# Patient Record
Sex: Female | Born: 1990 | Race: White | Hispanic: No | Marital: Married | Smoking: Never smoker
Health system: Southern US, Community
[De-identification: ages and names within clinical notes are randomized; demographics above are authoritative.]

## PROBLEM LIST (undated history)

## (undated) DIAGNOSIS — K219 Gastro-esophageal reflux disease without esophagitis: Secondary | ICD-10-CM

## (undated) DIAGNOSIS — Z8679 Personal history of other diseases of the circulatory system: Secondary | ICD-10-CM

## (undated) DIAGNOSIS — F988 Other specified behavioral and emotional disorders with onset usually occurring in childhood and adolescence: Secondary | ICD-10-CM

## (undated) DIAGNOSIS — F32A Depression, unspecified: Secondary | ICD-10-CM

## (undated) DIAGNOSIS — I10 Essential (primary) hypertension: Secondary | ICD-10-CM

## (undated) DIAGNOSIS — F419 Anxiety disorder, unspecified: Secondary | ICD-10-CM

## (undated) DIAGNOSIS — F329 Major depressive disorder, single episode, unspecified: Secondary | ICD-10-CM

## (undated) DIAGNOSIS — R000 Tachycardia, unspecified: Secondary | ICD-10-CM

## (undated) HISTORY — PX: FOOT SURGERY: SHX648

---
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
## 2010-02-07 ENCOUNTER — Ambulatory Visit: Payer: Self-pay | Admitting: Internal Medicine

## 2010-02-08 ENCOUNTER — Ambulatory Visit: Payer: Self-pay | Admitting: Internal Medicine

## 2010-07-31 ENCOUNTER — Ambulatory Visit: Payer: Self-pay | Admitting: Internal Medicine

## 2012-02-27 ENCOUNTER — Ambulatory Visit: Payer: Self-pay

## 2013-12-11 ENCOUNTER — Ambulatory Visit: Payer: Self-pay

## 2013-12-11 LAB — RAPID STREP-A WITH REFLX: MICRO TEXT REPORT: NEGATIVE

## 2013-12-14 LAB — BETA STREP CULTURE(ARMC)

## 2014-03-28 ENCOUNTER — Ambulatory Visit: Payer: Self-pay | Admitting: Neurology

## 2014-08-30 ENCOUNTER — Ambulatory Visit
Admission: EM | Admit: 2014-08-30 | Discharge: 2014-08-30 | Disposition: A | Discharge status: Home / Self Care | Payer: 59 | Attending: Family Medicine | Admitting: Family Medicine

## 2014-08-30 DIAGNOSIS — J029 Acute pharyngitis, unspecified: Secondary | ICD-10-CM | POA: Diagnosis not present

## 2014-08-30 HISTORY — DX: Tachycardia, unspecified: R00.0

## 2014-08-30 HISTORY — DX: Major depressive disorder, single episode, unspecified: F32.9

## 2014-08-30 HISTORY — DX: Depression, unspecified: F32.A

## 2014-08-30 LAB — RAPID STREP SCREEN (MED CTR MEBANE ONLY): STREPTOCOCCUS, GROUP A SCREEN (DIRECT): NEGATIVE

## 2014-08-30 MED ORDER — CLINDAMYCIN HCL 300 MG PO CAPS: 300.0000 mg | ORAL_CAPSULE | Freq: Three times a day (TID) | ORAL | Status: AC

## 2014-08-30 NOTE — ED Notes (Signed)
Sore throat and congestion, Fever of 101 at home. Started last night.

## 2014-08-30 NOTE — ED Provider Notes (Addendum)
SUBJECTIVE:  Jamie Hurley is a 24 y.o. female who complains of sore throat and fever since yesterday. No significant nasal drainage, abdominal pain, severe headache, rash, CP, SOB, N/V/D. Has had strep pharyngitis similar to this in the past.   ROS Negative except mentioned above.    OBJECTIVE: She appears well, vital signs are as noted.  General: NAD HEENT: moderate pharyngeal erythema, minimal exudate, no erythema of TMs, no cervical LAD Respiratory: CTA B Cardiology: tachycardic  Abdomen: +BS, NT/ND, no guarding or rebound Neurological: CN II-XII grossly intact   ASSESSMENT:  Pharyngitis   PLAN: Clindamycin since patient states can't swallow Amoxicillin and Zithromax has interactions with other medications patient takes,Tylenol/Motrin prn, rest, hydration, seek medical attention if symptoms persist or worsen.        Jolene Provost, MD 08/30/14 1157  Jolene Provost, MD 08/30/14 1610  Jolene Provost, MD 08/30/14 [phone]

## 2014-09-01 ENCOUNTER — Telehealth: Payer: Self-pay

## 2014-09-01 LAB — CULTURE, GROUP A STREP (THRC)

## 2014-09-01 NOTE — ED Notes (Signed)
Patient called stating Dr. Allena Katz had called yesterday to give Lab results but never go them. Result given and states is improving. Needs a work note for Sunday ad Monday (today). Given to patient

## 2014-10-21 ENCOUNTER — Other Ambulatory Visit: Payer: Self-pay | Admitting: Family Medicine

## 2014-10-21 DIAGNOSIS — G8929 Other chronic pain: Secondary | ICD-10-CM

## 2014-10-21 DIAGNOSIS — R1013 Epigastric pain: Principal | ICD-10-CM

## 2014-10-22 ENCOUNTER — Ambulatory Visit
Admission: RE | Admit: 2014-10-22 | Discharge: 2014-10-22 | Disposition: A | Discharge status: Home / Self Care | Payer: 59 | Source: Ambulatory Visit | Attending: Family Medicine | Admitting: Family Medicine

## 2014-10-22 DIAGNOSIS — G8929 Other chronic pain: Secondary | ICD-10-CM

## 2014-10-22 DIAGNOSIS — R1031 Right lower quadrant pain: Secondary | ICD-10-CM | POA: Diagnosis present

## 2014-10-22 DIAGNOSIS — R1013 Epigastric pain: Secondary | ICD-10-CM

## 2016-05-17 ENCOUNTER — Ambulatory Visit
Admission: EM | Admit: 2016-05-17 | Discharge: 2016-05-17 | Disposition: A | Discharge status: Home / Self Care | Payer: Worker's Compensation | Attending: Internal Medicine | Admitting: Internal Medicine

## 2016-05-17 ENCOUNTER — Ambulatory Visit: Payer: Worker's Compensation

## 2016-05-17 ENCOUNTER — Encounter: Payer: Self-pay | Admitting: Emergency Medicine

## 2016-05-17 DIAGNOSIS — S93432A Sprain of tibiofibular ligament of left ankle, initial encounter: Secondary | ICD-10-CM

## 2016-05-17 DIAGNOSIS — S93492A Sprain of other ligament of left ankle, initial encounter: Secondary | ICD-10-CM

## 2016-05-17 DIAGNOSIS — M25572 Pain in left ankle and joints of left foot: Secondary | ICD-10-CM

## 2016-05-17 NOTE — ED Provider Notes (Signed)
CSN: [id number]     Arrival date & time 05/17/16  1931 History   None    Chief Complaint  Patient presents with  . Ankle Pain   HPI Presents today for evaluation of left ankle pain following an injury at work today.  She works at [employer] in [place] and states that earlier today she was walking and slipped on water.  She reports an inversion injury to the left ankle and difficulty weightbearing since.   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  Pt reports pain along the lateral aspect of the ankle today, denies any medial sided pain.  No increase in swelling or bruising since the fall.  No numbness or tingling to the left ankle or foot.  This is possibly a worker's compensation case.  Past Medical History:  Diagnosis Date  . Depression   . Tachycardia    Past Surgical History:  Procedure Laterality Date  . FOOT SURGERY     No family history on file. Social History  Substance Use Topics  . Smoking status: Never Smoker  . Smokeless tobacco: Never Used  . Alcohol use Yes     Comment: occasional   OB History    No data available     Review of Systems  HENT: Negative.   Eyes: Negative.   Respiratory: Negative.   Cardiovascular: Negative.   Gastrointestinal: Negative.   Endocrine: Negative.   Genitourinary: Negative.   Musculoskeletal: Positive for gait problem and myalgias.  Skin: Negative.   Allergic/Immunologic: Negative.   Neurological: Positive for weakness. Negative for numbness.  Hematological: Negative.   Psychiatric/Behavioral: Negative.     Allergies  Patient has no known allergies.  Home Medications   Prior to Admission medications   Medication Sig Start Date End Date Taking? Authorizing Provider  acetaminophen (TYLENOL) 500 MG tablet Take 500 mg by mouth every 6 (six) hours as needed.   Yes [provider]  ibuprofen (ADVIL,MOTRIN) 400 MG tablet  Take 400 mg by mouth every 6 (six) hours as needed.   Yes [provider]  nebivolol (BYSTOLIC) 5 MG tablet Take 5 mg by mouth daily.   Yes [provider]  nortriptyline (PAMELOR) 10 MG capsule Take 10 mg by mouth at bedtime.   Yes [provider]  omeprazole (PRILOSEC) 20 MG capsule Take 20 mg by mouth daily.   Yes [provider]  traZODone (DESYREL) 50 MG tablet Take 50 mg by mouth at bedtime.   Yes [provider]  citalopram (CELEXA) 10 MG tablet Take 10 mg by mouth daily.    [provider]  metoprolol tartrate (LOPRESSOR) 25 MG tablet Take 25 mg by mouth 2 (two) times daily.    [provider]   Meds Ordered and Administered this Visit  Medications - No data to display  BP 116/71 (BP Location: Left Arm)   Pulse 82   Temp 99 F (37.2 C) (Oral)   Resp 16   Ht 5\' 1"  (1.549 m)   Wt 150 lb (68 kg)   LMP 05/07/2016   SpO2 100%   BMI 28.34 kg/m  No data found.  Physical Exam  Left Lower Extremity: Examination of the left lower extremity reveals no bony abnormality, no edema, and no ecchymosis.  The patient is non tender to palpation in the medial aspect of the ankle and moderately tender with palpation over the  lateral ankle.  The patient has no posterior but moderate anterior talotibial joint discomfort.  The patient is apprehensive with any motion to the left ankle at todays exam. The patient has no crepitus with range of motion activities.  The patient has a stable ankle with a negative anterior drawer test, and a negative subtalar tilt test.  Moderate tenderness laterally with any attempted ROM of the left ankle.  The patient has full dorsiflexion and plantar flexion against stress, showing full muscle strength however this motion is painful.  The patient has normal sensation to light touch.  The patient has a less than 2 second capillary refill.  The patient has normal skin warmth.    Urgent Care Course     Procedures  (including critical care time)  Labs Review Labs Reviewed - No data to display  Imaging Review Dg Ankle Complete Left  Result Date: 05/17/2016 CLINICAL DATA:  Pain following fall EXAM: LEFT ANKLE COMPLETE - 3+ VIEW COMPARISON:  None. FINDINGS: Frontal, oblique, and lateral views were obtained. There is mild soft tissue swelling. No fracture or joint effusion. Ankle mortise appears intact. No appreciable joint space narrowing or erosion. Postoperative changes noted in the proximal first metatarsal. IMPRESSION: Mild soft tissue swelling. No fracture or appreciable arthropathy. Ankle mortise appears intact. Electronically Signed   By: Bretta BangWilliam  Woodruff III M.D.   On: 05/17/2016 20:09   MDM   1. Sprain of anterior talofibular ligament of left ankle, initial encounter   -Patient was placed into a left ankle ASO brace today, continue crutches. -Given work note to remain out of work for the next 3 days, should follow-up with PCP if continued ankle discomfort at that time. -Tylenol and Ibuprofen as needed for discomfort at home.   Anson OregonMcGhee, Awais Cobarrubias Lance, [place]-C 05/17/16 2034

## 2016-05-17 NOTE — Discharge Instructions (Signed)
-  Remain out of work for 3 days. -Follow-up with PCP in next several days for further evaluation. -If persistent ankle pain, may need referral to orthopaedics

## 2016-05-17 NOTE — ED Triage Notes (Signed)
Left ankle pain started today at work

## 2016-05-21 ENCOUNTER — Ambulatory Visit
Admission: EM | Admit: 2016-05-21 | Discharge: 2016-05-21 | Disposition: A | Discharge status: Home / Self Care | Payer: Worker's Compensation | Attending: Family Medicine | Admitting: Family Medicine

## 2016-05-21 ENCOUNTER — Ambulatory Visit (INDEPENDENT_AMBULATORY_CARE_PROVIDER_SITE_OTHER): Payer: Worker's Compensation

## 2016-05-21 ENCOUNTER — Encounter: Payer: Self-pay | Admitting: Gynecology

## 2016-05-21 DIAGNOSIS — M25561 Pain in right knee: Secondary | ICD-10-CM

## 2016-05-21 NOTE — ED Triage Notes (Signed)
Patient was seen on 05/17/16 for left ankle pain. Patient had stated fallen at work. Patient now with pain in her right knee.

## 2016-05-21 NOTE — ED Provider Notes (Signed)
[facility] ____________________________________________  Time seen: Approximately 8:46 AM  I have reviewed the triage vital signs and the nursing notes.   HISTORY  Chief Complaint Knee Pain   HPI Jamie Hurley is a 26 y.o. female presents for evaluation of right knee pain. 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 Patient reports that on that day she was seen in the urgent care for evaluation of left ankle pain in which she states has much improved and does not bother her at this time. States today she is here for evaluation of right knee pain. States right knee pain currently is mild but worse with direct ambulation. Reports she has been resting her knee and taking over-the-counter ibuprofen which does help some. Patient states that she wanted to make sure that she had a fracture as the pain has continued. 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 Denies pain radiation, paresthesias or decreased range of motion. Denies head injury, loss of consciousness, neck or back pain or other injury.  Denies chest pain, shortness of breath, abdominal pain, dysuria, or rash. Denies recent sickness. Denies recent antibiotic use.   Sula Rumple, MD: PCP Patient's last menstrual period was 05/07/2016. Denies pregnancy.    Past Medical History:  Diagnosis Date  . Depression   . Tachycardia     There are no active problems to display for this patient.   Past Surgical History:  Procedure Laterality Date  . FOOT SURGERY       No current facility-administered medications for this encounter.   Current Outpatient Prescriptions:  .  acetaminophen (TYLENOL) 500 MG tablet, Take 500 mg by mouth every 6 (six) hours as needed., Disp: , Rfl:  .  citalopram (CELEXA) 10 MG tablet, Take 10 mg by mouth daily.,  Disp: , Rfl:  .  ibuprofen (ADVIL,MOTRIN) 400 MG tablet, Take 400 mg by mouth every 6 (six) hours as needed., Disp: , Rfl:  .  metoprolol tartrate (LOPRESSOR) 25 MG tablet, Take 25 mg by mouth 2 (two) times daily., Disp: , Rfl:  .  nebivolol (BYSTOLIC) 5 MG tablet, Take 5 mg by mouth daily., Disp: , Rfl:  .  nortriptyline (PAMELOR) 10 MG capsule, Take 10 mg by mouth at bedtime., Disp: , Rfl:  .  omeprazole (PRILOSEC) 20 MG capsule, Take 20 mg by mouth daily., Disp: , Rfl:  .  traZODone (DESYREL) 50 MG tablet, Take 50 mg by mouth at bedtime., Disp: , Rfl:   Allergies Patient has no known allergies.  No family history on file.  Social History Social History  Substance Use Topics  . Smoking status: Never Smoker  . Smokeless tobacco: Never Used  . Alcohol use Yes     Comment: occasional    Review of Systems Constitutional: No fever/chills Cardiovascular: Denies chest pain. Respiratory: Denies shortness of breath. Gastrointestinal: No abdominal pain.  No nausea, no vomiting.  No diarrhea.  No constipation. Genitourinary: Negative for dysuria. Musculoskeletal: Negative for back pain. As above. Skin: Negative for rash.  ____________________________________________   PHYSICAL EXAM:  VITAL SIGNS: ED Triage Vitals  Enc Vitals Group     BP 05/21/16 0835 106/68     Pulse Rate 05/21/16 0835 89     Resp 05/21/16 0835 16     Temp 05/21/16 0835 98.1  F (36.7 C)     Temp Source 05/21/16 0835 Oral     SpO2 05/21/16 0835 100 %     Weight 05/21/16 0837 150 lb (68 kg)     Height --      Head Circumference --      Peak Flow --      Pain Score 05/21/16 0837 7     Pain Loc --      Pain Edu? --      Excl. in GC? --     Constitutional: Alert and oriented. Well appearing and in no acute distress. ENT      Head: Normocephalic and atraumatic.. Cardiovascular: Normal rate, regular rhythm. Grossly normal heart sounds.  Good peripheral circulation. Respiratory: Normal respiratory effort  without tachypnea nor retractions. Breath sounds are clear and equal bilaterally. No wheezes, rales, rhonchi. Musculoskeletal:   No midline cervical, thoracic or lumbar tenderness to palpation. Bilateral pedal pulses equal and easily palpated.  except: Right anterior knee mild to moderate tenderness to direct palpation, mild pain with resisted knee extension, no pain with his knee flexion, no pain with medial or lateral stress, mild pain with anterior drawer test, no pain with posterior drawer test, no ecchymosis, no swelling noted, foreign range of motion present, right lower extremity otherwise nontender. Left ankle with splint present, no pain with palpation. Ambulatory with steady gait. Neurologic:  Normal speech and language. Speech is normal. No gait instability.  Skin:  Skin is warm, dry and intact. No rash noted. Psychiatric: Mood and affect are normal. Speech and behavior are normal. Patient exhibits appropriate insight and judgment   ___________________________________________   LABS (all labs ordered are listed, but only abnormal results are displayed)  Labs Reviewed - No data to display ____________________________________________  RADIOLOGY  Dg Knee Complete 4 Views Right  Result Date: 05/21/2016 CLINICAL DATA:  Pt with anterior right knee pain after fall four days ago. Pt denies hx of previous injury or trauma. EXAM: RIGHT KNEE - COMPLETE 4+ VIEW COMPARISON:  None. FINDINGS: No evidence of fracture, dislocation, or joint effusion. No evidence of arthropathy or other focal bone abnormality. Soft tissues are unremarkable. IMPRESSION: Negative. Electronically Signed   By: Bary RichardStan  Maynard M.D.   On: 05/21/2016 09:34   ____________________________________________   PROCEDURES Procedures   INITIAL IMPRESSION / ASSESSMENT AND PLAN / ED COURSE  Pertinent labs & imaging results that were available during my care of the patient were reviewed by me and considered in my medical decision  making (see chart for details).  Well-appearing patient. No acute distress. Presents for evaluation night right knee pain that patient reports is been present since fallen directly on her knee at work 4 days ago. Suspect contusion and strain injury, patient requests to have further evaluation x-ray. Right x-ray per radiologist negative. Supportive sleeve a splint applied by RN. Encouraged rest, ice, elevation, over-the-counter Tylenol or ibuprofen as needed. Patient states that she has a next 3 days off of work and plans to rest knee. Discussed with patient returning to work, patient agrees and states that she is rated return. Note given that patient can return to work as of today but keep in splint over the next 3 days. Information given for Tommie Maximiano CossAnne Moore NP as well as orthopedic for follow-up as needed for continued pain or complaints.  Discussed follow up with Primary care physician this week. Discussed follow up and return parameters including no resolution or any worsening concerns. Patient verbalized understanding and agreed to  plan.   ____________________________________________   FINAL CLINICAL IMPRESSION(S) / ED DIAGNOSES  Final diagnoses:  Acute pain of right knee     Discharge Medication List as of 05/21/2016  9:51 AM      Note: This dictation was prepared with Dragon dictation along with smaller phrase technology. Any transcriptional errors that result from this process are unintentional.         Renford Dills, NP 05/21/16 1125

## 2016-05-21 NOTE — Discharge Instructions (Signed)
Rest. Ice. Stretch. Follow up with Tommie Maximiano CossAnne Moore NP and Orthopedic as needed for continued pain.   Follow up with your primary care physician this week as needed. Return to Urgent care for new or worsening concerns.

## 2016-08-28 ENCOUNTER — Encounter: Payer: Self-pay | Admitting: Gynecology

## 2016-08-28 ENCOUNTER — Ambulatory Visit
Admission: EM | Admit: 2016-08-28 | Discharge: 2016-08-28 | Disposition: A | Discharge status: Home / Self Care | Payer: 59 | Attending: Family Medicine | Admitting: Family Medicine

## 2016-08-28 DIAGNOSIS — K2970 Gastritis, unspecified, without bleeding: Secondary | ICD-10-CM

## 2016-08-28 DIAGNOSIS — K625 Hemorrhage of anus and rectum: Secondary | ICD-10-CM | POA: Diagnosis not present

## 2016-08-28 DIAGNOSIS — K2990 Gastroduodenitis, unspecified, without bleeding: Secondary | ICD-10-CM

## 2016-08-28 HISTORY — DX: Gastro-esophageal reflux disease without esophagitis: K21.9

## 2016-08-28 HISTORY — DX: Essential (primary) hypertension: I10

## 2016-08-28 LAB — OCCULT BLOOD X 1 CARD TO LAB, STOOL: Fecal Occult Bld: NEGATIVE

## 2016-08-28 MED ORDER — MELOXICAM 15 MG PO TABS: 15.0000 mg | ORAL_TABLET | Freq: Every day | ORAL | 0 refills | Status: DC

## 2016-08-28 MED ORDER — ORPHENADRINE CITRATE ER 100 MG PO TB12: 100.0000 mg | ORAL_TABLET | Freq: Two times a day (BID) | ORAL | 0 refills | Status: DC

## 2016-08-28 NOTE — Discharge Instructions (Signed)
Recommend she use Imodium over-the-counter until the PCR results come back

## 2016-08-28 NOTE — ED Triage Notes (Signed)
Patient c/o bloody stool x 3 days ago. Per patient lower back pressure/ diarrhea and stomach pain.

## 2016-08-28 NOTE — ED Provider Notes (Signed)
[facility]    CSN: [id number] Arrival date & time: 08/28/16  0801     History   Chief Complaint Chief Complaint  Patient presents with  . Rectal Bleeding    HPI Jamie Hurley is a 26 y.o. female.   Patient is a 26 year old white female who comes in complaining of having diarrhea since Friday. During the day on Friday she had blood in her stool as well the obvious bleeding stopped after Friday but since then every time she eats she has diarrhea. She reports as well as having the diarrhea now having back pain that occurs off and on is not linked or tied to her having bowel movements. She has had a history of ovarian cyst but this pain is different from that. She denies having any nausea vomiting. No dyspareunia or vaginal discharge dysuria frequency or urgency. She's not had any fever. She states that a couple years ago she had episode of diarrhea with some rectal bleeding but quickly cleared within 24 hours. She states that her GYN suggested that she may want to think about getting a colonoscopy in the future. She's had multiple feet surgeries but no other abdominal or significant surgeries. Habits she is on birth control pill antidepressants and medication to regulate her heart rate and fluctuations in her blood pressures but no other medications. No known drug allergies she dips snuff but does not smoke. No pertinent family medical history for colon cancer or other GI cancer malignancy   The history is provided by the patient and the spouse. No language interpreter was used.  Rectal Bleeding  Quality:  Bright red Amount:  Scant Duration:  1 day Timing:  Intermittent Context: defecation, diarrhea and spontaneously   Context: not anal fissures, not anal penetration, not constipation, not foreign body, not hemorrhoids, not rectal injury and not rectal pain   Similar prior episodes: no   Relieved by:  Nothing Worsened by:  Defecation Ineffective treatments:  None  tried Associated symptoms: no abdominal pain, no fever and no vomiting   Associated symptoms comment:  Back pain   Past Medical History:  Diagnosis Date  . Depression   . GERD (gastroesophageal reflux disease)   . Hypertension   . Tachycardia     There are no active problems to display for this patient.   Past Surgical History:  Procedure Laterality Date  . FOOT SURGERY      OB History    No data available       Home Medications    Prior to Admission medications   Medication Sig Start Date End Date Taking? Authorizing Provider  aspirin EC 81 MG tablet Take 81 mg by mouth daily.   Yes [provider]  nebivolol (BYSTOLIC) 5 MG tablet Take 5 mg by mouth daily.   Yes [provider]  norethindrone-ethinyl estradiol (JUNEL FE,GILDESS FE,LOESTRIN FE) 1-20 MG-MCG tablet Take 1 tablet by mouth daily.   Yes [provider]  nortriptyline (PAMELOR) 10 MG capsule Take 10 mg by mouth at bedtime.   Yes [provider]  omeprazole (PRILOSEC) 20 MG capsule Take 20 mg by mouth daily.   Yes [provider]  traZODone (DESYREL) 50 MG tablet Take 50 mg by mouth at bedtime.   Yes [provider]  venlafaxine XR (EFFEXOR-XR) 150 MG 24 hr capsule Take 150 mg by mouth daily with breakfast.   Yes [provider]  acetaminophen (TYLENOL) 500 MG tablet Take 500 mg by mouth every  6 (six) hours as needed.    [provider]  citalopram (CELEXA) 10 MG tablet Take 10 mg by mouth daily.    [provider]  ibuprofen (ADVIL,MOTRIN) 400 MG tablet Take 400 mg by mouth every 6 (six) hours as needed.    [provider]  meloxicam (MOBIC) 15 MG tablet Take 1 tablet (15 mg total) by mouth daily. 08/28/16   Hassan Rowan, MD  metoprolol tartrate (LOPRESSOR) 25 MG tablet Take 25 mg by mouth 2 (two) times daily.    [provider]  orphenadrine (NORFLEX) 100 MG tablet Take 1 tablet (100 mg total) by mouth 2 (two)  times daily. 08/28/16   Hassan Rowan, MD    Family History No family history on file.  Social History Social History  Substance Use Topics  . Smoking status: Never Smoker  . Smokeless tobacco: Never Used  . Alcohol use Yes     Comment: occasional     Allergies   Patient has no known allergies.   Review of Systems Review of Systems  Constitutional: Negative for activity change, appetite change, chills, diaphoresis, fatigue and fever.  Gastrointestinal: Positive for blood in stool, diarrhea and hematochezia. Negative for abdominal distention, abdominal pain, nausea, rectal pain and vomiting.  Genitourinary: Negative for difficulty urinating, dyspareunia, dysuria, flank pain, frequency, hematuria, pelvic pain, vaginal bleeding and vaginal discharge.  Musculoskeletal: Positive for back pain and myalgias.  Skin: Negative.   All other systems reviewed and are negative.    Physical Exam Triage Vital Signs ED Triage Vitals  Enc Vitals Group     BP 08/28/16 0822 116/65     Pulse Rate 08/28/16 0822 91     Resp 08/28/16 0822 16     Temp 08/28/16 0822 98.4 F (36.9 C)     Temp Source 08/28/16 0822 Oral     SpO2 08/28/16 0822 100 %     Weight 08/28/16 0824 150 lb (68 kg)     Height 08/28/16 0824 5\' 1"  (1.549 m)     Head Circumference --      Peak Flow --      Pain Score 08/28/16 0824 8     Pain Loc --      Pain Edu? --      Excl. in GC? --    No data found.   Updated Vital Signs BP 116/65 (BP Location: Left Arm)   Pulse 91   Temp 98.4 F (36.9 C) (Oral)   Resp 16   Ht 5\' 1"  (1.549 m)   Wt 150 lb (68 kg)   LMP 08/07/2016   SpO2 100%   BMI 28.34 kg/m   Visual Acuity Right Eye Distance:   Left Eye Distance:   Bilateral Distance:    Right Eye Near:   Left Eye Near:    Bilateral Near:     Physical Exam  Constitutional: She is oriented to person, place, and time. She appears well-developed and well-nourished. No distress.  HENT:  Head: Normocephalic and  atraumatic.  Right Ear: External ear normal.  Left Ear: External ear normal.  Eyes: Pupils are equal, round, and reactive to light.  Neck: Normal range of motion. Neck supple.  Pulmonary/Chest: Effort normal.  Abdominal: Soft. Bowel sounds are normal. She exhibits no distension and no mass. There is no hepatosplenomegaly. There is tenderness. There is no rigidity, no rebound, no guarding and no CVA tenderness. Hernia confirmed negative in the ventral area.  Genitourinary: Rectum normal and vagina normal. Rectal  exam shows no external hemorrhoid, no internal hemorrhoid, no fissure, no mass, no tenderness, anal tone normal and guaiac negative stool. There is no rash or lesion on the right labia. There is no rash or lesion on the left labia.  Musculoskeletal: Normal range of motion.  Neurological: She is alert and oriented to person, place, and time.  Skin: Skin is warm. She is not diaphoretic.  Psychiatric: She has a normal mood and affect. Her behavior is normal.  Vitals reviewed.    UC Treatments / Results  Labs (all labs ordered are listed, but only abnormal results are displayed) Labs Reviewed  GASTROINTESTINAL PANEL BY PCR, STOOL (REPLACES STOOL CULTURE)  OCCULT BLOOD X 1 CARD TO LAB, STOOL    EKG  EKG Interpretation None       Radiology No results found.  Procedures Procedures (including critical care time)  Medications Ordered in UC Medications - No data to display   Initial Impression / Assessment and Plan / UC Course  I have reviewed the triage vital signs and the nursing notes.  Pertinent labs & imaging results that were available during my care of the patient were reviewed by me and considered in my medical decision making (see chart for details).    Stool was negative for blood. Will get a PCR to evaluate for pathogen will place her on Mobic and Norflex for back pain, recommend Imodium to use for the diarrhea for now until we get the pathogen reports back work  today and tomorrow and recommend colonoscopy which she can get referred by her PCP   Final Clinical Impressions(s) / UC Diagnoses   Final diagnoses:  Rectal bleeding  Gastritis and gastroduodenitis    New Prescriptions New Prescriptions   MELOXICAM (MOBIC) 15 MG TABLET    Take 1 tablet (15 mg total) by mouth daily.   ORPHENADRINE (NORFLEX) 100 MG TABLET    Take 1 tablet (100 mg total) by mouth 2 (two) times daily.   Note: This dictation was prepared with Dragon dictation along with smaller phrase technology. Any transcriptional errors that result from this process are unintentional. Controlled Substance Prescriptions [place] Controlled Substance Registry consulted? Not Applicable       Hassan Rowan, MD 08/28/16 [phone]

## 2016-08-29 LAB — GASTROINTESTINAL PANEL BY PCR, STOOL (REPLACES STOOL CULTURE)
ADENOVIRUS F40/41: NOT DETECTED
Astrovirus: NOT DETECTED
CAMPYLOBACTER SPECIES: NOT DETECTED
CRYPTOSPORIDIUM: NOT DETECTED
Cyclospora cayetanensis: NOT DETECTED
ENTEROAGGREGATIVE E COLI (EAEC): NOT DETECTED
ENTEROPATHOGENIC E COLI (EPEC): DETECTED — AB
Entamoeba histolytica: NOT DETECTED
Enterotoxigenic E coli (ETEC): NOT DETECTED
Giardia lamblia: NOT DETECTED
NOROVIRUS GI/GII: NOT DETECTED
PLESIMONAS SHIGELLOIDES: NOT DETECTED
ROTAVIRUS A: NOT DETECTED
SALMONELLA SPECIES: NOT DETECTED
SHIGELLA/ENTEROINVASIVE E COLI (EIEC): NOT DETECTED
Sapovirus (I, II, IV, and V): NOT DETECTED
Shiga like toxin producing E coli (STEC): NOT DETECTED
Vibrio cholerae: NOT DETECTED
Vibrio species: NOT DETECTED
Yersinia enterocolitica: NOT DETECTED

## 2017-01-30 ENCOUNTER — Encounter: Payer: Self-pay | Admitting: Obstetrics and Gynecology

## 2017-01-30 ENCOUNTER — Ambulatory Visit (INDEPENDENT_AMBULATORY_CARE_PROVIDER_SITE_OTHER): Payer: 59 | Admitting: Obstetrics and Gynecology

## 2017-01-30 VITALS — BP 100/60 | HR 79 | Ht 61.0 in | Wt 159.0 lb

## 2017-01-30 DIAGNOSIS — Z3041 Encounter for surveillance of contraceptive pills: Secondary | ICD-10-CM

## 2017-01-30 DIAGNOSIS — N926 Irregular menstruation, unspecified: Secondary | ICD-10-CM | POA: Diagnosis not present

## 2017-01-30 NOTE — Progress Notes (Signed)
Chief Complaint  Patient presents with  . Amenorrhea    missed this month period    HPI:      [name] is a [age] y.o. G1P0010 who LMP was Patient's last menstrual period was 01/01/2017 (exact date)., presents today for eval of no menses on OCPs last month. No late/missed OCPs, been on same ones for several yrs. Menses usually monthly, lasting 4-6 days, med flow, no dysmen, no BTB. She had normal menses last month but no period this month. She had neg UPT 2 days ago and restarted OCPs normally. She is concrened because UPTs were neg until [redacted] wks pregnant last time.  She is current on annaul with Dr. Elesa MassedWard, 5/18.    Past Medical History:  Diagnosis Date  . Depression   . GERD (gastroesophageal reflux disease)   . Hypertension   . Tachycardia     Past Surgical History:  Procedure Laterality Date  . FOOT SURGERY      Family History  Problem Relation Age of Onset  . Diabetes Mother   . Diabetes Father   . Diabetes Paternal Uncle   . Diabetes Paternal Grandmother     Social History   Socioeconomic History  . Marital status: Single    Spouse name: Not on file  . Number of children: Not on file  . Years of education: Not on file  . Highest education level: Not on file  Social Needs  . Financial resource strain: Not on file  . Food insecurity - worry: Not on file  . Food insecurity - inability: Not on file  . Transportation needs - medical: Not on file  . Transportation needs - non-medical: Not on file  Occupational History  . Not on file  Tobacco Use  . Smoking status: Never Smoker  . Smokeless tobacco: Never Used  Substance and Sexual Activity  . Alcohol use: Yes    Comment: occasional  . Drug use: Not on file  . Sexual activity: Yes    Birth control/protection: Pill  Other Topics Concern  . Not on file  Social History Narrative  . Not on file     Current Outpatient Medications:  .  acetaminophen (TYLENOL) 500 MG tablet, Take 500 mg by mouth  every 6 (six) hours as needed., Disp: , Rfl:  .  aspirin EC 81 MG tablet, Take 81 mg by mouth daily., Disp: , Rfl:  .  nebivolol (BYSTOLIC) 5 MG tablet, Take 5 mg by mouth daily., Disp: , Rfl:  .  norethindrone-ethinyl estradiol (JUNEL FE,GILDESS FE,LOESTRIN FE) 1-20 MG-MCG tablet, Take 1 tablet by mouth daily., Disp: , Rfl:  .  nortriptyline (PAMELOR) 10 MG capsule, Take 10 mg by mouth at bedtime., Disp: , Rfl:  .  omeprazole (PRILOSEC) 20 MG capsule, Take 20 mg by mouth daily., Disp: , Rfl:  .  traZODone (DESYREL) 50 MG tablet, Take 50 mg by mouth at bedtime., Disp: , Rfl:  .  venlafaxine XR (EFFEXOR-XR) 150 MG 24 hr capsule, Take 150 mg by mouth daily with breakfast., Disp: , Rfl:  .  citalopram (CELEXA) 10 MG tablet, Take 10 mg by mouth daily., Disp: , Rfl:  .  ibuprofen (ADVIL,MOTRIN) 400 MG tablet, Take 400 mg by mouth every 6 (six) hours as needed., Disp: , Rfl:  .  meloxicam (MOBIC) 15 MG tablet, Take 1 tablet (15 mg total) by mouth daily. (Patient not taking: Reported on 01/30/2017), Disp: 30 tablet, Rfl: 0 .  metoprolol tartrate (LOPRESSOR)  25 MG tablet, Take 25 mg by mouth 2 (two) times daily., Disp: , Rfl:  .  orphenadrine (NORFLEX) 100 MG tablet, Take 1 tablet (100 mg total) by mouth 2 (two) times daily. (Patient not taking: Reported on 01/30/2017), Disp: 60 tablet, Rfl: 0   ROS:  Review of Systems  Constitutional: Negative for fever.  Gastrointestinal: Negative for blood in stool, constipation, diarrhea, nausea and vomiting.  Genitourinary: Positive for menstrual problem. Negative for dyspareunia, dysuria, flank pain, frequency, hematuria, urgency, vaginal bleeding, vaginal discharge and vaginal pain.  Musculoskeletal: Negative for back pain.  Skin: Negative for rash.     OBJECTIVE:   Vitals:  BP 100/60   Pulse 79   Ht 5\' 1"  (1.549 m)   Wt 159 lb (72.1 kg)   LMP 01/01/2017 (Exact Date)   BMI 30.04 kg/m   Physical Exam  Constitutional: She is oriented to person,  place, and time and well-developed, well-nourished, and in no distress.  Neurological: She is alert and oriented to person, place, and time.  Psychiatric: Memory, affect and judgment normal.  Vitals reviewed.   Assessment/Plan: Late menses - Neg UPT 2 days ago but pt would feel better with serum beta. Check thyroid/prolactin, too. If neg, reassurance. Will f/u with results. Cont OCPs. - Plan: hCG, serum, qualitative, TSH + free T4, Prolactin  Encounter for surveillance of contraceptive pills   Return if symptoms worsen or fail to improve.  Elynor Kallenberger B. Jordell Outten, PA-C 01/30/2017 1:51 PM

## 2017-01-30 NOTE — Patient Instructions (Signed)
I value your feedback and entrusting us with your care. If you get a [facility] patient survey, I would appreciate you taking the time to let us know about your experience today. Thank you! 

## 2017-01-31 LAB — PROLACTIN: Prolactin: 10.4 ng/mL (ref 4.8–23.3)

## 2017-01-31 LAB — HCG, SERUM, QUALITATIVE: HCG, BETA SUBUNIT, QUAL, SERUM: NEGATIVE m[IU]/mL (ref <6)

## 2017-01-31 LAB — TSH+FREE T4
Free T4: 1.04 ng/dL (ref 0.82–1.77)
TSH: 1.18 u[IU]/mL (ref 0.450–4.500)

## 2017-11-25 ENCOUNTER — Other Ambulatory Visit: Payer: Self-pay

## 2017-11-25 ENCOUNTER — Ambulatory Visit
Admission: EM | Admit: 2017-11-25 | Discharge: 2017-11-25 | Disposition: A | Discharge status: Home / Self Care | Payer: BLUE CROSS/BLUE SHIELD | Attending: Family Medicine | Admitting: Family Medicine

## 2017-11-25 DIAGNOSIS — R197 Diarrhea, unspecified: Secondary | ICD-10-CM | POA: Diagnosis not present

## 2017-11-25 DIAGNOSIS — R110 Nausea: Secondary | ICD-10-CM | POA: Diagnosis not present

## 2017-11-25 HISTORY — DX: Anxiety disorder, unspecified: F41.9

## 2017-11-25 HISTORY — DX: Other specified behavioral and emotional disorders with onset usually occurring in childhood and adolescence: F98.8

## 2017-11-25 HISTORY — DX: Personal history of other diseases of the circulatory system: Z86.79

## 2017-11-25 LAB — HCG, QUANTITATIVE, PREGNANCY

## 2017-11-25 MED ORDER — ONDANSETRON 4 MG PO TBDP: 4.0000 mg | ORAL_TABLET | Freq: Three times a day (TID) | ORAL | 0 refills | Status: DC | PRN

## 2017-11-25 NOTE — Discharge Instructions (Signed)
Lots of fluids.  Zofran if needed.  No atenolol.  Follow up with PCP.   Take care  Dr. Adriana Simasook

## 2017-11-25 NOTE — ED Triage Notes (Signed)
Past week has felt tired and nauseated. Negative home pregnancy test. Nausea is worse in the evenings. No vomiting. Started with diarrhea this a.m. Has had 3 episodes today. Not eating much but drinking plenty of water and ginger ale. No abd pain

## 2017-11-25 NOTE — ED Provider Notes (Signed)
[facility]    CSN: [id number] Arrival date & time: 11/25/17  0818  History   Chief Complaint Chief Complaint  Patient presents with  . Nausea  . Diarrhea   HPI  27 year old female presents with nausea, fatigue, diarrhea.  Patient reports a 1 to 1.5-week history of nausea and fatigue.  Patient states that her nausea is worse in the evening.  She says some sore throat and headache as well.  She states that she has now had diarrhea as of today.  Subjective fever.  No documented fever.  She is had a recent change in her medications.  Has been taking atenolol.  She will be switching to nadolol but has not done so yet.  She called her cardiologist and she was informed that this is unlikely to cause her symptoms.  Patient is on oral contraceptives.  Last menstrual period was 10/28.  She had a negative home pregnancy test.  Is able to eat and drink.  Just does not feel well.  No abdominal pain.  No urinary symptoms.  No other associated symptoms.  No other complaints.  History reviewed as below. Past Medical History:  Diagnosis Date  . ADD (attention deficit disorder)   . Anxiety   . Depression   . H/O sinus tachycardia    Past Surgical History:  Procedure Laterality Date  . FOOT SURGERY     OB History   None    Home Medications    Prior to Admission medications   Medication Sig Start Date End Date Taking? Authorizing Provider  famotidine (PEPCID) 40 MG tablet  11/24/17   [provider]  gabapentin (NEURONTIN) 100 MG capsule  11/17/17   [provider]  JUNEL FE 1/20 1-20 MG-MCG tablet TK 1 T PO QD 09/01/17   [provider]  Methylphenidate HCl ER 54 MG TB24 TK 1 T PO QAM 08/30/17   [provider]  nadolol (CORGARD) 20 MG tablet  11/17/17   [provider]  nortriptyline (PAMELOR) 10 MG capsule TK 2 CS PO QAM 10/31/17   [provider]  nortriptyline (PAMELOR) 50 MG capsule  10/31/17   [provider]    ondansetron (ZOFRAN-ODT) 4 MG disintegrating tablet Take 1 tablet (4 mg total) by mouth every 8 (eight) hours as needed for nausea or vomiting. 11/25/17   Tommie Sams, DO  traZODone (DESYREL) 50 MG tablet TK 1 T PO QHS 10/31/17   [provider]  venlafaxine XR (EFFEXOR-XR) 150 MG 24 hr capsule TK 2 CS PO QD 10/31/17   [provider]   Social History Social History   Tobacco Use  . Smoking status: Never Smoker  . Smokeless tobacco: Current User    Types: Chew  Substance Use Topics  . Alcohol use: Yes    Comment: occasional  . Drug use: Not Currently     Allergies   Patient has no known allergies.   Review of Systems Review of Systems  Constitutional: Positive for fatigue.  HENT: Positive for sore throat.   Gastrointestinal: Positive for diarrhea and nausea. Negative for abdominal pain and vomiting.  Neurological: Positive for headaches.   Physical Exam Triage Vital Signs ED Triage Vitals  Enc Vitals Group     BP 11/25/17 0833 123/86     Pulse Rate 11/25/17 0833 94     Resp 11/25/17 0833 16     Temp 11/25/17 0833 98.2 F (36.8 C)     Temp Source 11/25/17 0833 Oral  SpO2 11/25/17 0833 99 %     Weight 11/25/17 0834 160 lb (72.6 kg)     Height 11/25/17 0834 5\' 1"  (1.549 m)     Head Circumference --      Peak Flow --      Pain Score 11/25/17 0834 0     Pain Loc --      Pain Edu? --      Excl. in GC? --     Updated Vital Signs BP 123/86 (BP Location: Left Arm)   Pulse 94   Temp 98.2 F (36.8 C) (Oral)   Resp 16   Ht 5\' 1"  (1.549 m)   Wt 72.6 kg   LMP 11/06/2017   SpO2 99%   BMI 30.23 kg/m   Visual Acuity Right Eye Distance:   Left Eye Distance:   Bilateral Distance:    Right Eye Near:   Left Eye Near:    Bilateral Near:     Physical Exam  Constitutional: She is oriented to person, place, and time. She appears well-developed. No distress.  HENT:  Head: Normocephalic and atraumatic.  Eyes: Conjunctivae are normal. Right eye  exhibits no discharge. Left eye exhibits no discharge.  Cardiovascular: Normal rate and normal heart sounds.  Pulmonary/Chest: Effort normal and breath sounds normal. She has no wheezes. She has no rales.  Abdominal: Soft. She exhibits no distension. There is no tenderness.  Neurological: She is alert and oriented to person, place, and time.  Psychiatric: She has a normal mood and affect. Her behavior is normal.  Nursing note and vitals reviewed.  UC Treatments / Results  Labs (all labs ordered are listed, but only abnormal results are displayed) Labs Reviewed  HCG, QUANTITATIVE, PREGNANCY    EKG None  Radiology No results found.  Procedures Procedures (including critical care time)  Medications Ordered in UC Medications - No data to display  Initial Impression / Assessment and Plan / UC Course  I have reviewed the triage vital signs and the nursing notes.  Pertinent labs & imaging results that were available during my care of the patient were reviewed by me and considered in my medical decision making (see chart for details).    27 year old female presents with nausea, diarrhea, fatigue.  Atenolol is known to cause the side effects.  It is not particular frequent but is a known cause.  Advised to stop atenolol.  She is switching to now all.  She is not pregnant.  Zofran as needed.  Supportive care.  Final Clinical Impressions(s) / UC Diagnoses   Final diagnoses:  Nausea  Diarrhea, unspecified type     Discharge Instructions     Lots of fluids.  Zofran if needed.  No atenolol.  Follow up with PCP.   Take care  Dr. Adriana Simasook     ED Prescriptions    Medication Sig Dispense Auth. Provider   ondansetron (ZOFRAN-ODT) 4 MG disintegrating tablet Take 1 tablet (4 mg total) by mouth every 8 (eight) hours as needed for nausea or vomiting. 20 tablet Tommie Samsook, Samier Jaco G, DO     Controlled Substance Prescriptions [place] Controlled Substance Registry consulted? Not Applicable     Tommie SamsCook, Mariann Palo G, DO 11/25/17 1015

## 2017-11-27 ENCOUNTER — Encounter: Payer: Self-pay | Admitting: Obstetrics and Gynecology

## 2018-10-05 IMAGING — CR DG KNEE COMPLETE 4+V*R*
4 series · 4 of 4 positions shown · non-contrast
Comparison: None.

CLINICAL DATA: Pt with anterior right knee pain after fall four
days ago. Pt denies hx of previous injury or trauma.

EXAM:
RIGHT KNEE - COMPLETE 4+ VIEW

[knee ap (1 of 3)]
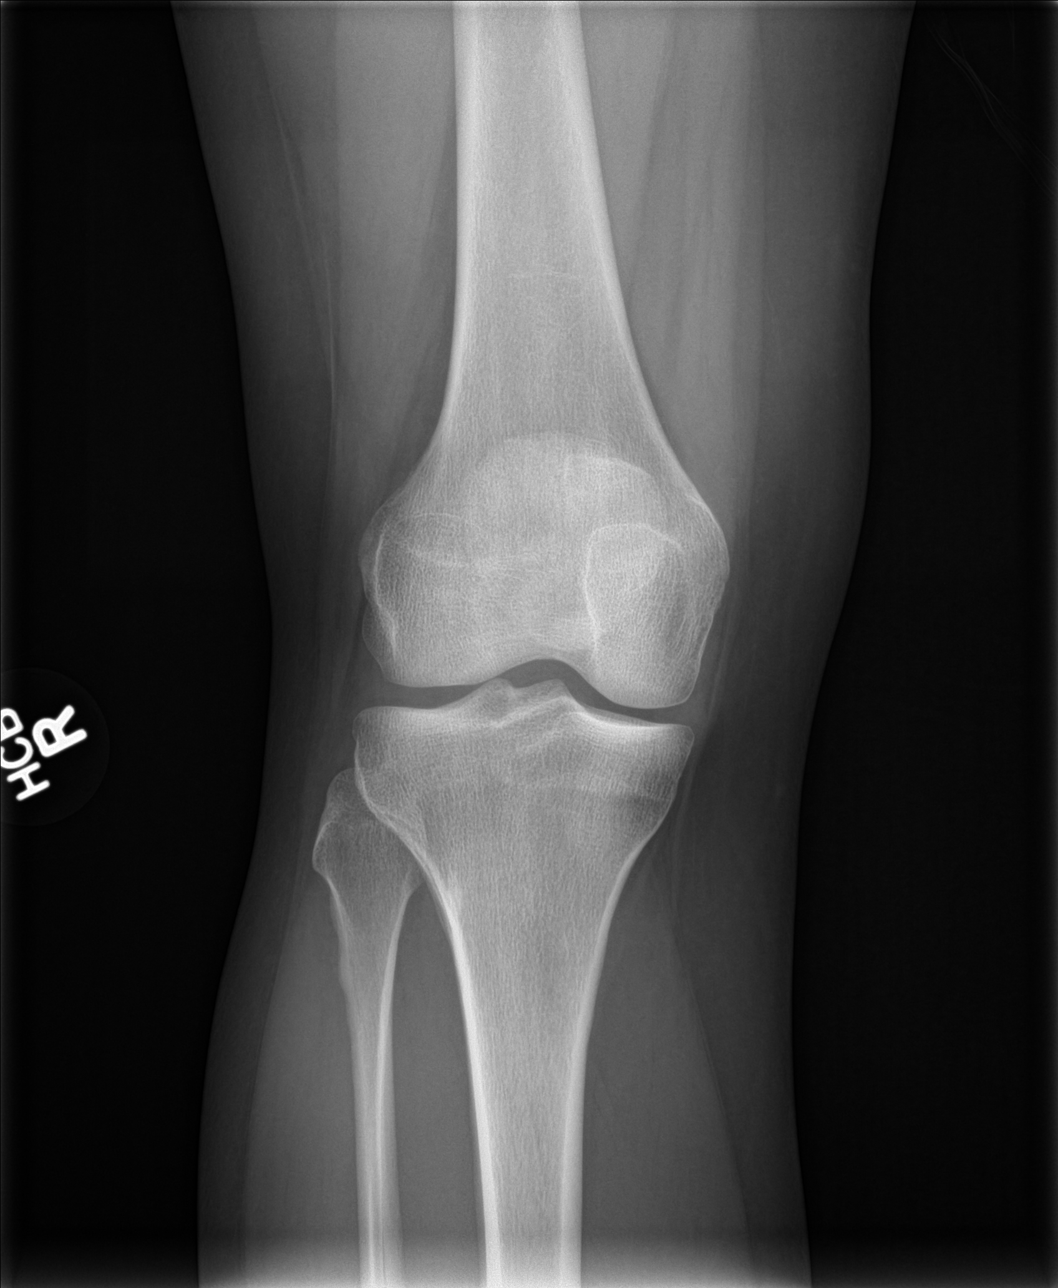

[knee ap (2 of 3)]
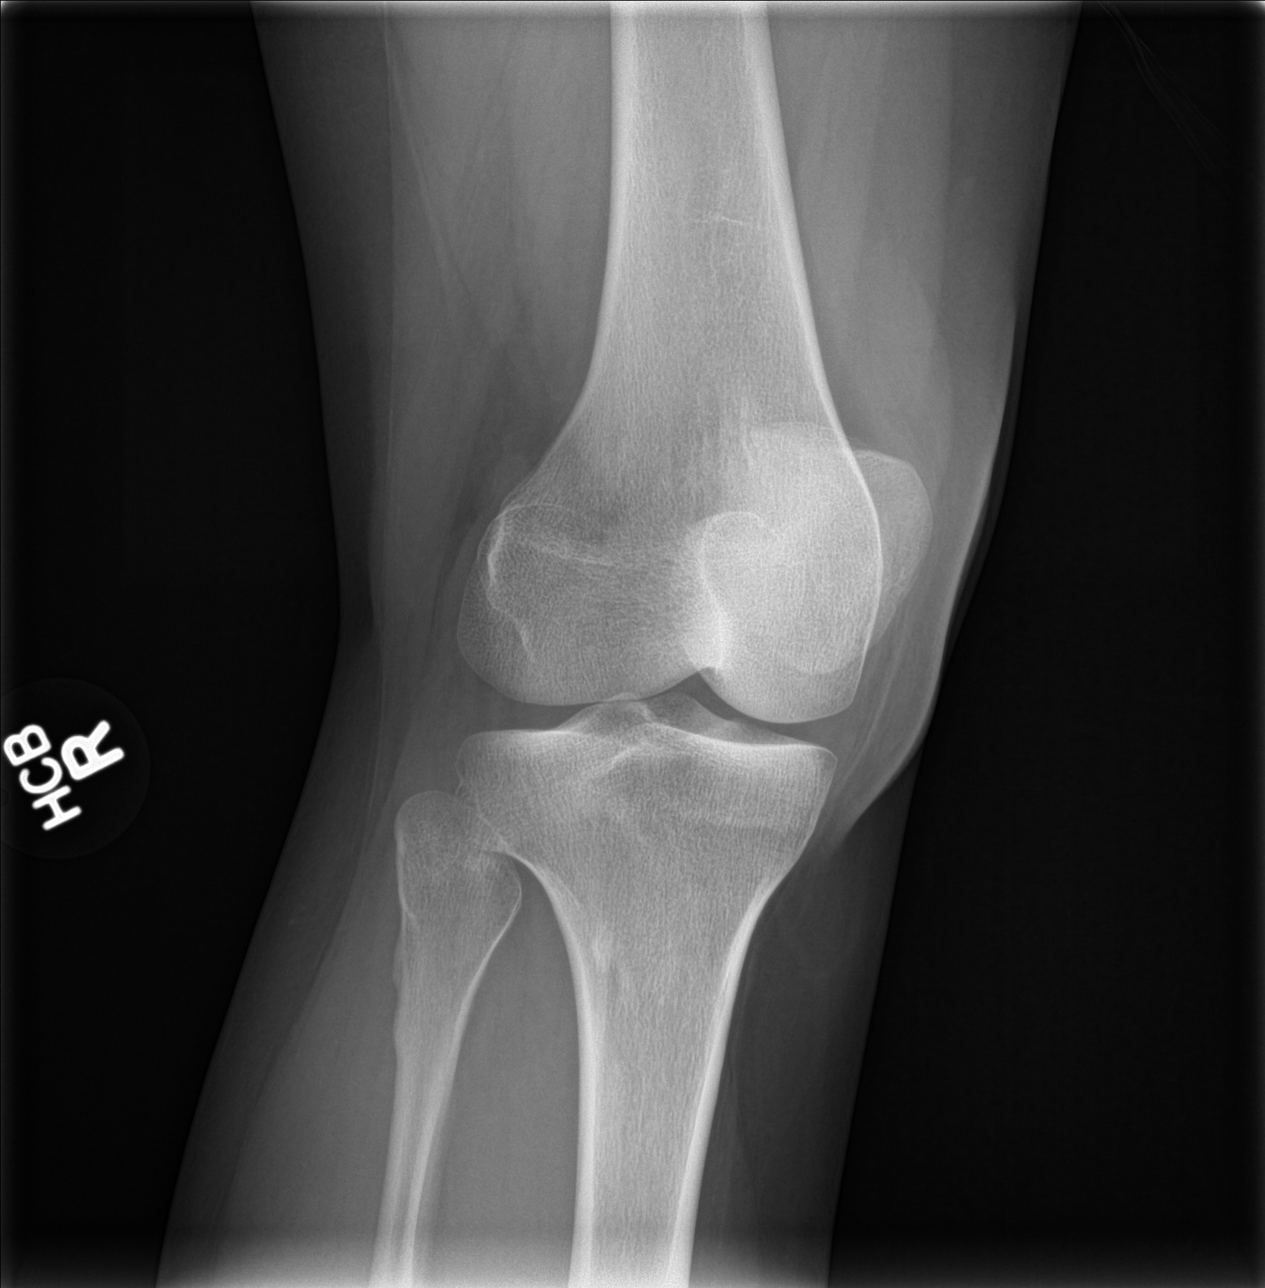

[knee ap (3 of 3)]
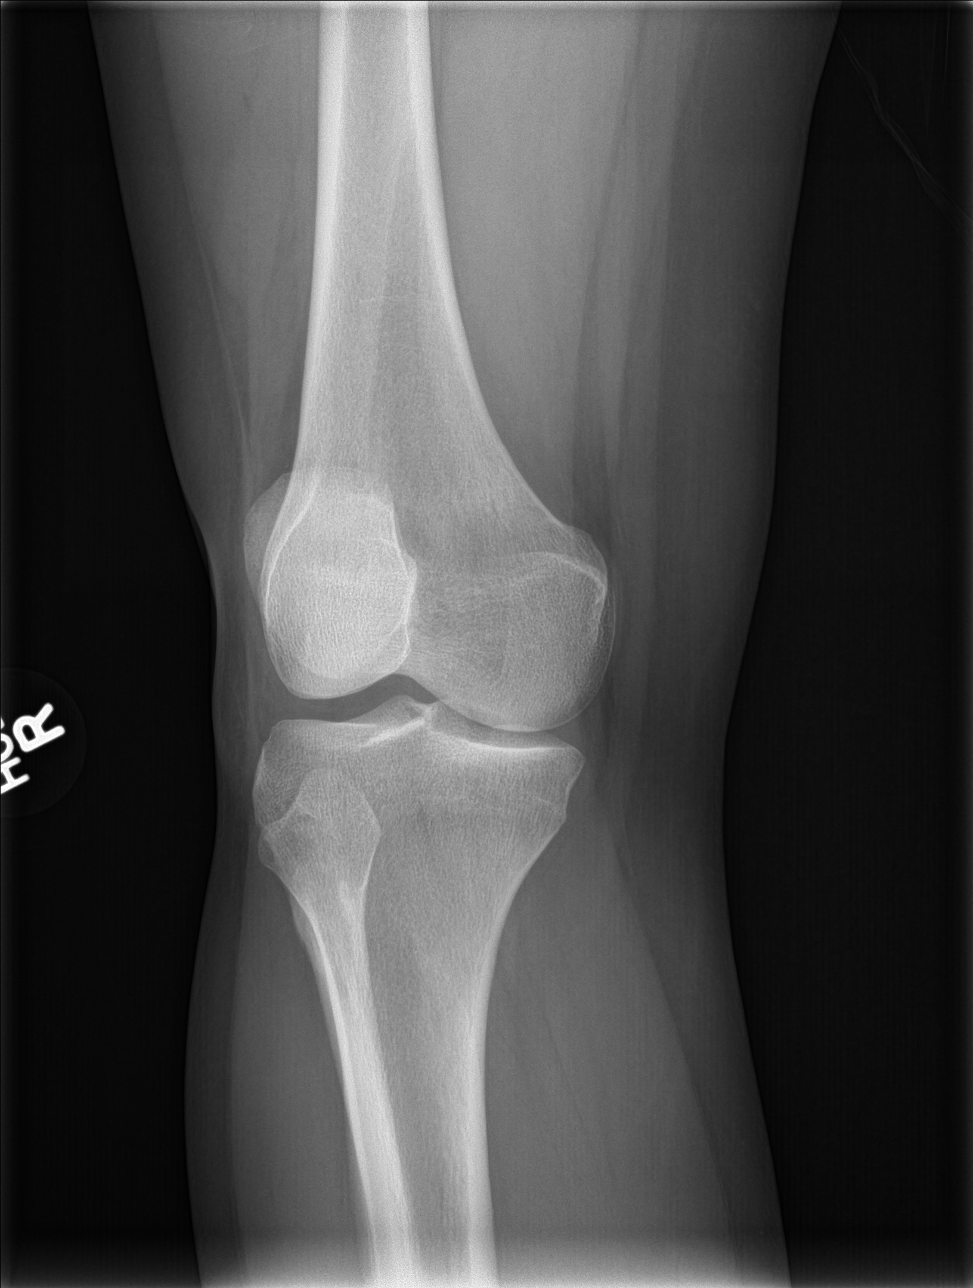

[knee lat]
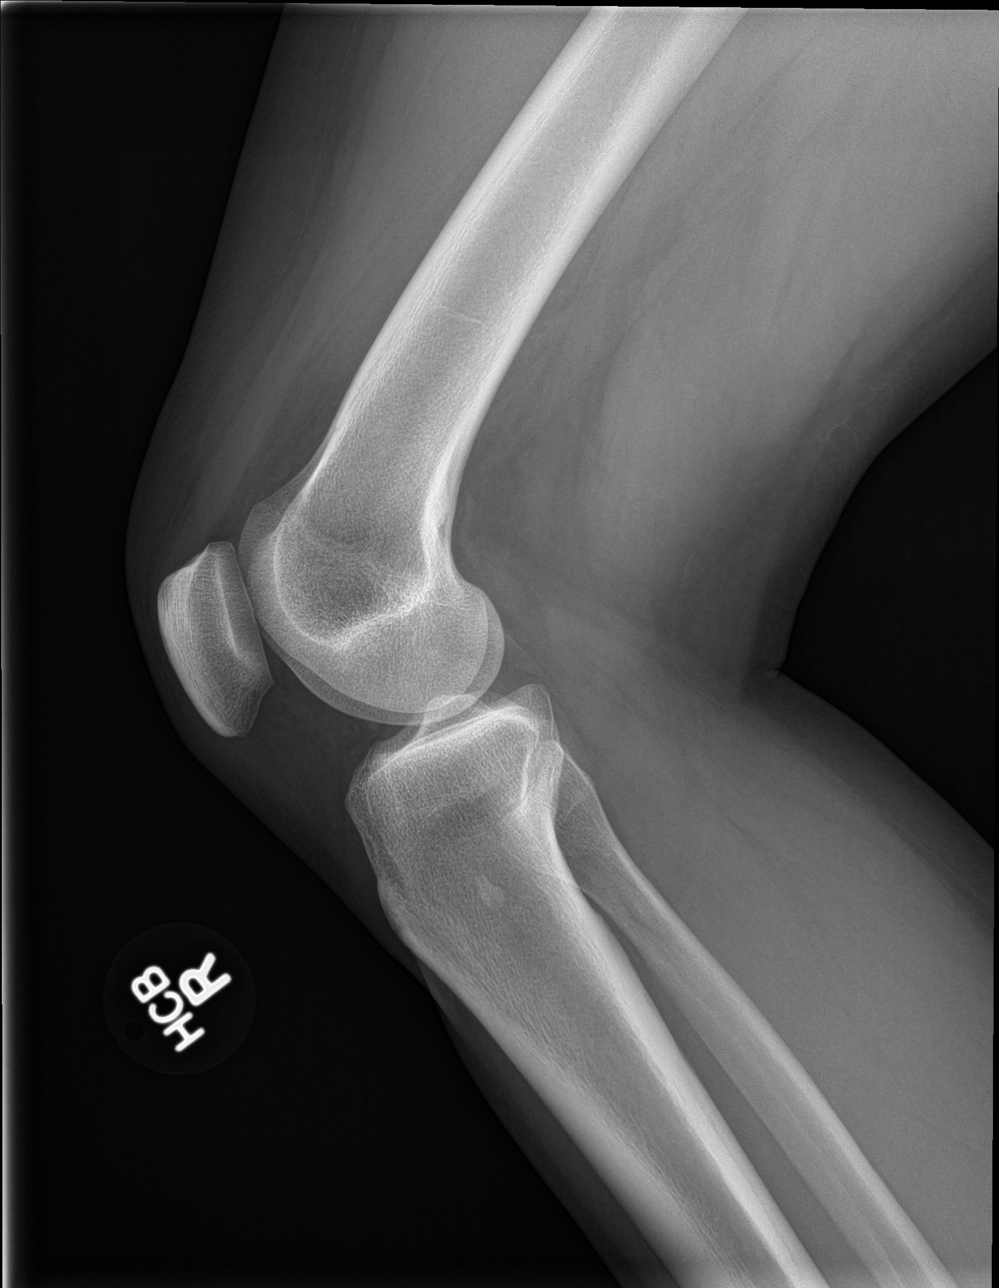

[4 of 4 positions shown; findings below may reference images not displayed]

FINDINGS: No evidence of fracture, dislocation, or joint effusion. No evidence
of arthropathy or other focal bone abnormality. Soft tissues are
unremarkable.
IMPRESSION: Negative.

## 2019-01-05 ENCOUNTER — Ambulatory Visit
Admission: EM | Admit: 2019-01-05 | Discharge: 2019-01-05 | Disposition: A | Discharge status: Home / Self Care | Payer: 59 | Attending: Internal Medicine | Admitting: Internal Medicine

## 2019-01-05 ENCOUNTER — Other Ambulatory Visit: Payer: Self-pay

## 2019-01-05 ENCOUNTER — Encounter: Payer: Self-pay | Admitting: Emergency Medicine

## 2019-01-05 DIAGNOSIS — F329 Major depressive disorder, single episode, unspecified: Secondary | ICD-10-CM | POA: Insufficient documentation

## 2019-01-05 DIAGNOSIS — Z833 Family history of diabetes mellitus: Secondary | ICD-10-CM | POA: Insufficient documentation

## 2019-01-05 DIAGNOSIS — Z79899 Other long term (current) drug therapy: Secondary | ICD-10-CM | POA: Insufficient documentation

## 2019-01-05 DIAGNOSIS — J029 Acute pharyngitis, unspecified: Secondary | ICD-10-CM

## 2019-01-05 DIAGNOSIS — K219 Gastro-esophageal reflux disease without esophagitis: Secondary | ICD-10-CM | POA: Diagnosis not present

## 2019-01-05 DIAGNOSIS — Z20828 Contact with and (suspected) exposure to other viral communicable diseases: Secondary | ICD-10-CM | POA: Diagnosis not present

## 2019-01-05 DIAGNOSIS — I10 Essential (primary) hypertension: Secondary | ICD-10-CM | POA: Diagnosis not present

## 2019-01-05 DIAGNOSIS — F419 Anxiety disorder, unspecified: Secondary | ICD-10-CM | POA: Insufficient documentation

## 2019-01-05 LAB — GROUP A STREP BY PCR: Group A Strep by PCR: NOT DETECTED

## 2019-01-05 MED ORDER — PENICILLIN V POTASSIUM 500 MG PO TABS: 500.0000 mg | ORAL_TABLET | Freq: Two times a day (BID) | ORAL | 0 refills | Status: AC

## 2019-01-05 NOTE — ED Triage Notes (Signed)
Patient reports some discomfort when she swallows that started last night.  Patient states that when she looks inside her mouth it looked swollen.  Patient denies fevers.

## 2019-01-05 NOTE — ED Provider Notes (Signed)
[facility]    CSN: [id number] Arrival date & time: 01/05/19  0957      History   Chief Complaint Chief Complaint  Patient presents with  . Sore Throat    HPI Jamie Hurley is a 28 y.o. female. who presents with recurrent sore throat which started last night, but due to having strep frequently , 5 this year she wanted to be checked. The last time she had positive rapid strep was 2 months ago. She does not normally get fevers until her throat pain gets bad. Today is just a little sore. Has hx of chronic PND. Has not seen ENT, but her PCP has told her that she has not had that many strep infections to have a tonsillectomy yet.     Past Medical History:  Diagnosis Date  . ADD (attention deficit disorder)   . Anxiety   . Depression   . GERD (gastroesophageal reflux disease)   . H/O sinus tachycardia   . Hypertension   . Tachycardia     There are no problems to display for this patient.   Past Surgical History:  Procedure Laterality Date  . FOOT SURGERY      OB History    Gravida  1   Para  0   Term  0   Preterm  0   AB  1   Living        SAB  0   TAB  0   Ectopic  0   Multiple      Live Births               Home Medications    Prior to Admission medications   Medication Sig Start Date End Date Taking? Authorizing Provider  famotidine (PEPCID) 40 MG tablet  11/24/17  Yes [provider]  gabapentin (NEURONTIN) 100 MG capsule  11/17/17  Yes [provider]  Methylphenidate HCl ER 54 MG TB24 TK 1 T PO QAM 08/30/17  Yes [provider]  nadolol (CORGARD) 20 MG tablet  11/17/17  Yes [provider]  JUNEL FE 1/20 1-20 MG-MCG tablet TK 1 T PO QD 09/01/17 01/05/19 Yes [provider]  acetaminophen (TYLENOL) 500 MG tablet Take 500 mg by mouth every 6 (six) hours as needed.    [provider]  ibuprofen (ADVIL,MOTRIN) 400 MG tablet Take 400 mg by mouth every 6 (six) hours as  needed.    [provider]  ondansetron (ZOFRAN-ODT) 4 MG disintegrating tablet Take 1 tablet (4 mg total) by mouth every 8 (eight) hours as needed for nausea or vomiting. 11/25/17   Coral Spikes, DO  citalopram (CELEXA) 10 MG tablet Take 10 mg by mouth daily.  01/05/19  [provider]  metoprolol tartrate (LOPRESSOR) 25 MG tablet Take 25 mg by mouth 2 (two) times daily.  01/05/19  [provider]  nebivolol (BYSTOLIC) 5 MG tablet Take 5 mg by mouth daily.  01/05/19  [provider]  nortriptyline (PAMELOR) 10 MG capsule TK 2 CS PO QAM 10/31/17 01/05/19  [provider]  omeprazole (PRILOSEC) 20 MG capsule Take 20 mg by mouth daily.  01/05/19  [provider]  traZODone (DESYREL) 50 MG tablet TK 1 T PO QHS 10/31/17 01/05/19  [provider]  venlafaxine XR (EFFEXOR-XR) 150 MG 24 hr capsule TK 2 CS PO QD 10/31/17 01/05/19  [provider]    Family History Family History  Problem Relation Age of Onset  .  Diabetes Mother   . Diabetes Father   . Diabetes Paternal Uncle   . Diabetes Paternal Grandmother     Social History Social History   Tobacco Use  . Smoking status: Never Smoker  . Smokeless tobacco: Current User    Types: Chew  Substance Use Topics  . Alcohol use: Yes    Comment: occasional  . Drug use: Not Currently     Allergies   Patient has no known allergies.   Review of Systems Review of Systems  Constitutional: Positive for fatigue. Negative for chills and fever.  HENT: Positive for postnasal drip. Negative for rhinorrhea, sore throat and trouble swallowing.   Respiratory: Negative for cough, chest tightness, shortness of breath and wheezing.   Cardiovascular: Negative for chest pain.  Skin: Negative for rash.  Neurological: Negative for dizziness.  Hematological: Negative for adenopathy.     Physical Exam Triage Vital Signs ED Triage Vitals  Enc Vitals Group     BP 01/05/19 1013  112/83     Pulse Rate 01/05/19 1013 97     Resp 01/05/19 1013 14     Temp 01/05/19 1013 98.6 F (37 C)     Temp Source 01/05/19 1013 Oral     SpO2 01/05/19 1013 98 %     Weight 01/05/19 1007 175 lb (79.4 kg)     Height 01/05/19 1007 5\' 1"  (1.549 m)     Head Circumference --      Peak Flow --      Pain Score 01/05/19 1007 0     Pain Loc --      Pain Edu? --      Excl. in GC? --    No data found.  Updated Vital Signs BP 112/83 (BP Location: Left Arm)   Pulse 97   Temp 98.6 F (37 C) (Oral)   Resp 14   Ht 5\' 1"  (1.549 m)   Wt 175 lb (79.4 kg)   LMP 12/29/2018 (Approximate)   SpO2 98%   BMI 33.07 kg/m   Visual Acuity Right Eye Distance:   Left Eye Distance:   Bilateral Distance:    Right Eye Near:   Left Eye Near:    Bilateral Near:     Physical Exam Vitals and nursing note reviewed.  Constitutional:      General: She is not in acute distress.    Appearance: She is well-developed. She is not toxic-appearing.  HENT:     Head: Atraumatic.     Right Ear: Tympanic membrane and ear canal normal.     Left Ear: Tympanic membrane and ear canal normal.     Nose: No congestion or rhinorrhea.     Mouth/Throat:     Mouth: No oral lesions.     Pharynx: Uvula midline. No pharyngeal swelling, oropharyngeal exudate or posterior oropharyngeal erythema.     Tonsils: No tonsillar exudate or tonsillar abscesses. 0 on the right. 0 on the left.  Eyes:     Conjunctiva/sclera: Conjunctivae normal.  Cardiovascular:     Rate and Rhythm: Normal rate and regular rhythm.     Heart sounds: No murmur.  Pulmonary:     Effort: Pulmonary effort is normal.     Breath sounds: Normal breath sounds.  Musculoskeletal:     Cervical back: Neck supple.  Lymphadenopathy:     Cervical: No cervical adenopathy.  Skin:    General: Skin is warm and dry.     Findings: No rash.  Neurological:  General: No focal deficit present.     Mental Status: She is alert and oriented to person, place, and  time.  Psychiatric:        Mood and Affect: Mood normal.        Behavior: Behavior normal.      UC Treatments / Results  Labs (all labs ordered are listed, but only abnormal results are displayed) Labs Reviewed  GROUP A STREP BY PCR  NOVEL CORONAVIRUS, NAA (HOSP ORDER, SEND-OUT TO REF LAB; TAT 18-24 HRS)    EKG   Radiology No results found.  Procedures Procedures (including critical care time)  Medications Ordered in UC Medications - No data to display  Initial Impression / Assessment and Plan / UC Course  I have reviewed the triage vital signs and the nursing notes. Due to hx of recurrent strep infections and 80 % of the time rapid turns positive and only once this year her culture was neg I went ahead and placed her on PNC 500 mg bid x 10 days. Throat culture ordered and we will inform her when the results are back She was advised to try Netie Pot saline rinses bid to see if this helps the chronic post nasal drainage she has.  Final Clinical Impressions(s) / UC Diagnoses   Final diagnoses:  None   Discharge Instructions   None    ED Prescriptions    None     PDMP not reviewed this encounter.   Garey HamRodriguez-Southworth, Gabriella Guile, [place]PA-C 01/05/19 1115

## 2019-01-05 NOTE — Discharge Instructions (Addendum)
We will let you know if the throat culture turns positive in 48 hours to see if you should continue taking the Penicillin Try doing saline rinses with a Netie Pot twice a day for the post nasal drainage and see if the frequent sore throats resolve.  Follow with your family Dr. To see if it is time to be sent to ear nose and throat Dr.

## 2019-01-06 LAB — NOVEL CORONAVIRUS, NAA (HOSP ORDER, SEND-OUT TO REF LAB; TAT 18-24 HRS): SARS-CoV-2, NAA: NOT DETECTED
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
## 2020-07-21 ENCOUNTER — Other Ambulatory Visit: Payer: Self-pay

## 2020-07-21 ENCOUNTER — Telehealth (INDEPENDENT_AMBULATORY_CARE_PROVIDER_SITE_OTHER): Payer: 59 | Admitting: Psychiatry

## 2020-07-21 ENCOUNTER — Encounter: Payer: Self-pay | Admitting: Psychiatry

## 2020-07-21 DIAGNOSIS — R41840 Attention and concentration deficit: Secondary | ICD-10-CM

## 2020-07-21 DIAGNOSIS — G4700 Insomnia, unspecified: Secondary | ICD-10-CM | POA: Diagnosis not present

## 2020-07-21 DIAGNOSIS — F3342 Major depressive disorder, recurrent, in full remission: Secondary | ICD-10-CM | POA: Diagnosis not present

## 2020-07-21 DIAGNOSIS — F633 Trichotillomania: Secondary | ICD-10-CM | POA: Diagnosis not present

## 2020-07-21 MED ORDER — VENLAFAXINE HCL ER 150 MG PO CP24: ORAL_CAPSULE | ORAL | 2 refills | Status: AC

## 2020-07-21 MED ORDER — TRAZODONE HCL 50 MG PO TABS: 50.0000 mg | ORAL_TABLET | Freq: Every day | ORAL | 2 refills | Status: AC

## 2020-07-21 MED ORDER — VENLAFAXINE HCL ER 75 MG PO CP24: ORAL_CAPSULE | ORAL | 2 refills | Status: AC

## 2020-07-21 NOTE — Patient Instructions (Signed)
1. Continue venlafaxine 225 mg 2. Continue trazodone 50 mg at night as needed for sleep 3. Hold the Concerta at this time pending further evaluation 4. Obtain labs- UDS,  TSH  6. Obtain record from [facility] 7. Next appointment+ 8/16 at 8 30

## 2020-07-22 NOTE — Addendum Note (Signed)
Addended by: Neysa Hotter on: 07/22/2020 08:28 AM   Modules accepted: Orders
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
## 2020-08-25 ENCOUNTER — Telehealth (INDEPENDENT_AMBULATORY_CARE_PROVIDER_SITE_OTHER): Payer: 59 | Admitting: Psychiatry

## 2020-08-25 ENCOUNTER — Other Ambulatory Visit: Payer: Self-pay

## 2020-08-25 ENCOUNTER — Encounter: Payer: Self-pay | Admitting: Psychiatry

## 2020-08-25 DIAGNOSIS — F330 Major depressive disorder, recurrent, mild: Secondary | ICD-10-CM

## 2020-08-25 DIAGNOSIS — G4700 Insomnia, unspecified: Secondary | ICD-10-CM

## 2020-08-25 MED ORDER — BUPROPION HCL ER (XL) 150 MG PO TB24: 150.0000 mg | ORAL_TABLET | Freq: Every day | ORAL | 1 refills | Status: AC

## 2020-08-25 NOTE — Patient Instructions (Signed)
Continue venlafaxine 225 mg Start bupropion 150 mg daily  Continue trazodone 50 mg at night as needed for sleep Obtain UDS Obtain TSH  Next appointment- 10/6 at 10 AM, video

## 2020-09-11 ENCOUNTER — Ambulatory Visit: Payer: 59 | Admitting: Psychology

## 2020-09-23 ENCOUNTER — Ambulatory Visit: Payer: 59 | Admitting: Psychology

## 2020-10-01 ENCOUNTER — Ambulatory Visit: Payer: 59 | Admitting: Psychology

## 2020-10-13 NOTE — Progress Notes (Deleted)
BH MD/PA/NP OP Progress Note  10/13/2020 5:13 PM Jamie Hurley  MRN:  [id number]  Chief Complaint:  HPI: *** Visit Diagnosis: No diagnosis found.  Past Psychiatric History: Please see initial evaluation for full details. I have reviewed the history. No updates at this time.     Past Medical History:  Past Medical History:  Diagnosis Date   ADD (attention deficit disorder)    Anxiety    Depression    GERD (gastroesophageal reflux disease)    H/O sinus tachycardia    Hypertension    Tachycardia     Past Surgical History:  Procedure Laterality Date   FOOT SURGERY      Family Psychiatric History: Please see initial evaluation for full details. I have reviewed the history. No updates at this time.     Family History:  Family History  Problem Relation Age of Onset   Diabetes Mother    Anxiety disorder Father    Depression Father    Diabetes Father    Depression Paternal Uncle    Diabetes Paternal Uncle    Diabetes Paternal Grandmother     Social History:  Social History   Socioeconomic History   Marital status: Married    Spouse name: Not on file   Number of children: Not on file   Years of education: Not on file   Highest education level: Not on file  Occupational History   Not on file  Tobacco Use   Smoking status: Never   Smokeless tobacco: Current    Types: Chew  Vaping Use   Vaping Use: Never used  Substance and Sexual Activity   Alcohol use: Yes    Comment: occasional   Drug use: Not Currently   Sexual activity: Yes    Birth control/protection: Pill  Other Topics Concern   Not on file  Social History Narrative   ** Merged History Encounter **       Social Determinants of Health   Financial Resource Strain: Not on file  Food Insecurity: Not on file  Transportation Needs: Not on file  Physical Activity: Not on file  Stress: Not on file  Social Connections: Not on file    Allergies: No Known Allergies  Metabolic Disorder  Labs: No results found for: HGBA1C, MPG Lab Results  Component Value Date   PROLACTIN 10.4 01/30/2017   No results found for: CHOL, TRIG, HDL, CHOLHDL, VLDL, LDLCALC Lab Results  Component Value Date   TSH 1.180 01/30/2017    Therapeutic Level Labs: No results found for: LITHIUM No results found for: VALPROATE No components found for:  CBMZ  Current Medications: Current Outpatient Medications  Medication Sig Dispense Refill   acetaminophen (TYLENOL) 500 MG tablet Take 500 mg by mouth every 6 (six) hours as needed.     buPROPion (WELLBUTRIN XL) 150 MG 24 hr tablet Take 1 tablet (150 mg total) by mouth daily. 30 tablet 1   famotidine (PEPCID) 40 MG tablet   0   gabapentin (NEURONTIN) 100 MG capsule Take 200 mg by mouth daily.  2   ibuprofen (ADVIL,MOTRIN) 400 MG tablet Take 400 mg by mouth every 6 (six) hours as needed.     nadolol (CORGARD) 20 MG tablet   10   nortriptyline (PAMELOR) 10 MG capsule Take 20 mg by mouth every 30 (thirty) days.     nortriptyline (PAMELOR) 50 MG capsule Take 50 mg by mouth at bedtime.     ondansetron (ZOFRAN-ODT) 4 MG disintegrating tablet Take 1  tablet (4 mg total) by mouth every 8 (eight) hours as needed for nausea or vomiting. 20 tablet 0   traZODone (DESYREL) 50 MG tablet Take 1 tablet (50 mg total) by mouth at bedtime. 30 tablet 2   venlafaxine XR (EFFEXOR-XR) 150 MG 24 hr capsule Take total of 225 mg daily, along with 75 mg cap 30 capsule 2   venlafaxine XR (EFFEXOR-XR) 75 MG 24 hr capsule Take total of 225 mg daily, along with 150 mg cap 30 capsule 2   No current facility-administered medications for this visit.     Musculoskeletal: Strength & Muscle Tone:  N/A Gait & Station:  N/A Patient leans: N/A  Psychiatric Specialty Exam: Review of Systems  There were no vitals taken for this visit.There is no height or weight on file to calculate BMI.  General Appearance: {Appearance:22683}  Eye Contact:  {[facility] EYE CONTACT:22684}  Speech:  Clear  and Coherent  Volume:  Normal  Mood:  {B[facility] MOOD:22306}  Affect:  {Affect (PAA):22687}  Thought Process:  Coherent  Orientation:  Full (Time, Place, and Person)  Thought Content: Logical   Suicidal Thoughts:  {ST/HT (PAA):22692}  Homicidal Thoughts:  {ST/HT (PAA):22692}  Memory:  Immediate;   Good  Judgement:  {Judgement (PAA):22694}  Insight:  {Insight (PAA):22695}  Psychomotor Activity:  Normal  Concentration:  Concentration: Good and Attention Span: Good  Recall:  Good  Fund of Knowledge: Good  Language: Good  Akathisia:  No  Handed:  Right  AIMS (if indicated): not done  Assets:  Communication Skills Desire for Improvement  ADL's:  Intact  Cognition: WNL  Sleep:  {[facility] GOOD/FAIR/POOR:22877}   Screenings: PHQ2-9    Flowsheet Row Video Visit from 07/21/2020 in [facility]  PHQ-2 Total Score 0        Assessment and Plan:  Jamie Hurley is a 30 y.o. year old female with a history of anxiety, SVT/inappropriate sinus tachycardia, presyncope & Hx COVID-19 (01/2020),, who presents for follow up appointment for below.     1. MDD (major depressive disorder), recurrent episode, mild (HCC) # Trichotillomania She reports worsening in depressive symptoms with prominent fatigue since the last visit.  Psychosocial stressors includes starting school, although she enjoys this.  Will add bupropion as adjunctive treatment for depression, and this medication could be beneficial for inattention as well.  Discussed potential risk of palpitation, headache.  She has no known history of seizure.  Will continue current dose of venlafaxine at this time to target depression and anxiety.    2. Insomnia, unspecified type She has initial and middle insomnia, snoring, and daytime fatigue.  Will make referral for evaluation of sleep apnea.    3. Inattention Unchanged.  Etiology of inattention is multifactorial given her mood symptoms , insomnia.  She was reportedly  diagnosed with ADHD at [facility], and has had good benefit from Concerta.  She reports worsening in ADHD symptoms since discontinuation of Concerta.  We will obtain UDS to safely prescribe this medication.  Noted that she has a history of alcohol abuse and marijuana use in the past; referral was made for ADHD evaluation to safely treat her condition.    Plan Continue venlafaxine 225 mg Start bupropion 150 mg daily  Continue trazodone 50 mg at night as needed for sleep Hold the Concerta at this time pending further evaluation; she was on 54 mg. Obtain UDS Obtain TSH to rule out medical condition contributing to her symptoms Obtain record from [facility]  Next appointment- 10/6 at 10 AM for 30 mins, video   - on  nortriptyline 20 mg AM, 27m qhs- for headache   Past trials of medication: Abilify, she does not recall other medication     The patient demonstrates the following risk factors for suicide: Chronic risk factors for suicide include: psychiatric disorder of depression, anxiety . Acute risk factors for suicide include: N/A. Protective factors for this patient include: positive social support, coping skills, and hope for the future. Considering these factors, the overall suicide risk at this point appears to be low. Patient is appropriate for outpatient follow up.         Neysa Hotter, MD 10/13/2020, 5:13 PM

## 2020-10-15 ENCOUNTER — Telehealth: Payer: Self-pay | Admitting: Psychiatry

## 2020-10-15 ENCOUNTER — Other Ambulatory Visit: Payer: Self-pay

## 2020-10-15 NOTE — Telephone Encounter (Signed)
Sent link for video visit through Epic. Patient did not sign in. Called the patient for appointment scheduled today. The patient did not answer the phone. Left voice message to contact the office ([phone]).   ?

## 2021-02-23 DIAGNOSIS — R69 Illness, unspecified: Secondary | ICD-10-CM | POA: Diagnosis not present

## 2021-03-05 DIAGNOSIS — E6609 Other obesity due to excess calories: Secondary | ICD-10-CM | POA: Diagnosis not present

## 2021-03-05 DIAGNOSIS — Z79899 Other long term (current) drug therapy: Secondary | ICD-10-CM | POA: Diagnosis not present

## 2021-03-05 DIAGNOSIS — Z131 Encounter for screening for diabetes mellitus: Secondary | ICD-10-CM | POA: Diagnosis not present

## 2021-03-05 DIAGNOSIS — Z6836 Body mass index (BMI) 36.0-36.9, adult: Secondary | ICD-10-CM | POA: Diagnosis not present

## 2021-03-05 DIAGNOSIS — R5383 Other fatigue: Secondary | ICD-10-CM | POA: Diagnosis not present

## 2021-03-05 DIAGNOSIS — Z13220 Encounter for screening for lipoid disorders: Secondary | ICD-10-CM | POA: Diagnosis not present

## 2021-03-05 DIAGNOSIS — R251 Tremor, unspecified: Secondary | ICD-10-CM | POA: Diagnosis not present

## 2021-03-09 DIAGNOSIS — R69 Illness, unspecified: Secondary | ICD-10-CM | POA: Diagnosis not present

## 2021-03-23 DIAGNOSIS — R69 Illness, unspecified: Secondary | ICD-10-CM | POA: Diagnosis not present

## 2021-03-29 DIAGNOSIS — Z113 Encounter for screening for infections with a predominantly sexual mode of transmission: Secondary | ICD-10-CM | POA: Diagnosis not present

## 2021-03-29 DIAGNOSIS — Z3041 Encounter for surveillance of contraceptive pills: Secondary | ICD-10-CM | POA: Diagnosis not present

## 2021-03-29 DIAGNOSIS — R69 Illness, unspecified: Secondary | ICD-10-CM | POA: Diagnosis not present

## 2021-03-29 DIAGNOSIS — Z01419 Encounter for gynecological examination (general) (routine) without abnormal findings: Secondary | ICD-10-CM | POA: Diagnosis not present

## 2021-04-09 DIAGNOSIS — R69 Illness, unspecified: Secondary | ICD-10-CM | POA: Diagnosis not present

## 2021-04-12 DIAGNOSIS — R000 Tachycardia, unspecified: Secondary | ICD-10-CM | POA: Diagnosis not present

## 2021-04-20 DIAGNOSIS — R69 Illness, unspecified: Secondary | ICD-10-CM | POA: Diagnosis not present

## 2021-04-28 DIAGNOSIS — F419 Anxiety disorder, unspecified: Secondary | ICD-10-CM | POA: Diagnosis not present

## 2021-04-28 DIAGNOSIS — R69 Illness, unspecified: Secondary | ICD-10-CM | POA: Diagnosis not present

## 2021-04-28 DIAGNOSIS — F331 Major depressive disorder, recurrent, moderate: Secondary | ICD-10-CM | POA: Diagnosis not present

## 2021-04-28 DIAGNOSIS — F633 Trichotillomania: Secondary | ICD-10-CM | POA: Diagnosis not present

## 2021-05-26 DIAGNOSIS — F419 Anxiety disorder, unspecified: Secondary | ICD-10-CM | POA: Diagnosis not present

## 2021-05-26 DIAGNOSIS — F909 Attention-deficit hyperactivity disorder, unspecified type: Secondary | ICD-10-CM | POA: Diagnosis not present

## 2021-05-26 DIAGNOSIS — R69 Illness, unspecified: Secondary | ICD-10-CM | POA: Diagnosis not present

## 2021-05-26 DIAGNOSIS — F633 Trichotillomania: Secondary | ICD-10-CM | POA: Diagnosis not present

## 2021-06-15 DIAGNOSIS — R69 Illness, unspecified: Secondary | ICD-10-CM | POA: Diagnosis not present

## 2021-06-25 DIAGNOSIS — F909 Attention-deficit hyperactivity disorder, unspecified type: Secondary | ICD-10-CM | POA: Diagnosis not present

## 2021-06-25 DIAGNOSIS — F633 Trichotillomania: Secondary | ICD-10-CM | POA: Diagnosis not present

## 2021-06-25 DIAGNOSIS — F419 Anxiety disorder, unspecified: Secondary | ICD-10-CM | POA: Diagnosis not present

## 2021-06-25 DIAGNOSIS — R69 Illness, unspecified: Secondary | ICD-10-CM | POA: Diagnosis not present

## 2021-06-29 DIAGNOSIS — M216X1 Other acquired deformities of right foot: Secondary | ICD-10-CM | POA: Diagnosis not present

## 2021-06-29 DIAGNOSIS — G5782 Other specified mononeuropathies of left lower limb: Secondary | ICD-10-CM | POA: Diagnosis not present

## 2021-06-29 DIAGNOSIS — Q66222 Congenital metatarsus adductus, left foot: Secondary | ICD-10-CM | POA: Diagnosis not present

## 2021-06-29 DIAGNOSIS — M79672 Pain in left foot: Secondary | ICD-10-CM | POA: Diagnosis not present

## 2021-06-29 DIAGNOSIS — M7742 Metatarsalgia, left foot: Secondary | ICD-10-CM | POA: Diagnosis not present

## 2021-06-29 DIAGNOSIS — M216X2 Other acquired deformities of left foot: Secondary | ICD-10-CM | POA: Diagnosis not present

## 2021-06-29 DIAGNOSIS — Q667 Congenital pes cavus, unspecified foot: Secondary | ICD-10-CM | POA: Diagnosis not present

## 2021-06-29 DIAGNOSIS — M84375A Stress fracture, left foot, initial encounter for fracture: Secondary | ICD-10-CM | POA: Diagnosis not present

## 2021-07-20 DIAGNOSIS — R69 Illness, unspecified: Secondary | ICD-10-CM | POA: Diagnosis not present

## 2021-07-22 DIAGNOSIS — R519 Headache, unspecified: Secondary | ICD-10-CM | POA: Diagnosis not present

## 2021-07-22 DIAGNOSIS — R69 Illness, unspecified: Secondary | ICD-10-CM | POA: Diagnosis not present

## 2021-07-22 DIAGNOSIS — E538 Deficiency of other specified B group vitamins: Secondary | ICD-10-CM | POA: Diagnosis not present

## 2021-07-22 DIAGNOSIS — G2581 Restless legs syndrome: Secondary | ICD-10-CM | POA: Diagnosis not present

## 2021-07-27 DIAGNOSIS — M216X2 Other acquired deformities of left foot: Secondary | ICD-10-CM | POA: Diagnosis not present

## 2021-07-27 DIAGNOSIS — M216X1 Other acquired deformities of right foot: Secondary | ICD-10-CM | POA: Diagnosis not present

## 2021-07-27 DIAGNOSIS — M84375D Stress fracture, left foot, subsequent encounter for fracture with routine healing: Secondary | ICD-10-CM | POA: Diagnosis not present

## 2021-07-27 DIAGNOSIS — M7742 Metatarsalgia, left foot: Secondary | ICD-10-CM | POA: Diagnosis not present

## 2021-07-27 DIAGNOSIS — M79672 Pain in left foot: Secondary | ICD-10-CM | POA: Diagnosis not present

## 2021-08-10 DIAGNOSIS — M216X2 Other acquired deformities of left foot: Secondary | ICD-10-CM | POA: Diagnosis not present

## 2021-08-10 DIAGNOSIS — Q667 Congenital pes cavus, unspecified foot: Secondary | ICD-10-CM | POA: Diagnosis not present

## 2021-08-10 DIAGNOSIS — M79672 Pain in left foot: Secondary | ICD-10-CM | POA: Diagnosis not present

## 2021-08-10 DIAGNOSIS — M7742 Metatarsalgia, left foot: Secondary | ICD-10-CM | POA: Diagnosis not present

## 2021-08-10 DIAGNOSIS — M216X1 Other acquired deformities of right foot: Secondary | ICD-10-CM | POA: Diagnosis not present

## 2021-08-10 DIAGNOSIS — Q66222 Congenital metatarsus adductus, left foot: Secondary | ICD-10-CM | POA: Diagnosis not present

## 2021-08-10 DIAGNOSIS — M84375D Stress fracture, left foot, subsequent encounter for fracture with routine healing: Secondary | ICD-10-CM | POA: Diagnosis not present

## 2021-08-30 DIAGNOSIS — R69 Illness, unspecified: Secondary | ICD-10-CM | POA: Diagnosis not present

## 2021-08-31 DIAGNOSIS — F633 Trichotillomania: Secondary | ICD-10-CM | POA: Diagnosis not present

## 2021-08-31 DIAGNOSIS — F419 Anxiety disorder, unspecified: Secondary | ICD-10-CM | POA: Diagnosis not present

## 2021-08-31 DIAGNOSIS — F909 Attention-deficit hyperactivity disorder, unspecified type: Secondary | ICD-10-CM | POA: Diagnosis not present

## 2021-08-31 DIAGNOSIS — R69 Illness, unspecified: Secondary | ICD-10-CM | POA: Diagnosis not present

## 2021-10-25 DIAGNOSIS — R69 Illness, unspecified: Secondary | ICD-10-CM | POA: Diagnosis not present

## 2021-11-15 DIAGNOSIS — F633 Trichotillomania: Secondary | ICD-10-CM | POA: Diagnosis not present

## 2021-11-15 DIAGNOSIS — F909 Attention-deficit hyperactivity disorder, unspecified type: Secondary | ICD-10-CM | POA: Diagnosis not present

## 2021-11-15 DIAGNOSIS — F419 Anxiety disorder, unspecified: Secondary | ICD-10-CM | POA: Diagnosis not present

## 2021-11-15 DIAGNOSIS — R69 Illness, unspecified: Secondary | ICD-10-CM | POA: Diagnosis not present

## 2021-11-25 DIAGNOSIS — R69 Illness, unspecified: Secondary | ICD-10-CM | POA: Diagnosis not present

## 2021-12-07 DIAGNOSIS — R69 Illness, unspecified: Secondary | ICD-10-CM | POA: Diagnosis not present

## 2021-12-20 DIAGNOSIS — R519 Headache, unspecified: Secondary | ICD-10-CM | POA: Diagnosis not present

## 2021-12-20 DIAGNOSIS — G2581 Restless legs syndrome: Secondary | ICD-10-CM | POA: Diagnosis not present

## 2021-12-20 DIAGNOSIS — R69 Illness, unspecified: Secondary | ICD-10-CM | POA: Diagnosis not present

## 2021-12-20 DIAGNOSIS — E538 Deficiency of other specified B group vitamins: Secondary | ICD-10-CM | POA: Diagnosis not present

## 2021-12-29 DIAGNOSIS — R69 Illness, unspecified: Secondary | ICD-10-CM | POA: Diagnosis not present

## 2022-01-12 DIAGNOSIS — R69 Illness, unspecified: Secondary | ICD-10-CM | POA: Diagnosis not present

## 2022-01-12 DIAGNOSIS — F633 Trichotillomania: Secondary | ICD-10-CM | POA: Diagnosis not present

## 2022-01-12 DIAGNOSIS — F419 Anxiety disorder, unspecified: Secondary | ICD-10-CM | POA: Diagnosis not present

## 2022-01-12 DIAGNOSIS — F909 Attention-deficit hyperactivity disorder, unspecified type: Secondary | ICD-10-CM | POA: Diagnosis not present

## 2022-01-18 DIAGNOSIS — R0602 Shortness of breath: Secondary | ICD-10-CM | POA: Diagnosis not present

## 2022-01-18 DIAGNOSIS — M79604 Pain in right leg: Secondary | ICD-10-CM | POA: Diagnosis not present

## 2022-01-18 DIAGNOSIS — R000 Tachycardia, unspecified: Secondary | ICD-10-CM | POA: Diagnosis not present

## 2022-01-18 DIAGNOSIS — M79661 Pain in right lower leg: Secondary | ICD-10-CM | POA: Diagnosis not present

## 2022-01-18 DIAGNOSIS — M79605 Pain in left leg: Secondary | ICD-10-CM | POA: Diagnosis not present

## 2022-01-18 DIAGNOSIS — M79662 Pain in left lower leg: Secondary | ICD-10-CM | POA: Diagnosis not present

## 2022-01-18 DIAGNOSIS — R079 Chest pain, unspecified: Secondary | ICD-10-CM | POA: Diagnosis not present

## 2022-01-18 DIAGNOSIS — R0789 Other chest pain: Secondary | ICD-10-CM | POA: Diagnosis not present

## 2022-01-18 DIAGNOSIS — R55 Syncope and collapse: Secondary | ICD-10-CM | POA: Diagnosis not present

## 2022-01-19 DIAGNOSIS — R69 Illness, unspecified: Secondary | ICD-10-CM | POA: Diagnosis not present

## 2022-02-09 DIAGNOSIS — R69 Illness, unspecified: Secondary | ICD-10-CM | POA: Diagnosis not present

## 2022-02-15 DIAGNOSIS — R519 Headache, unspecified: Secondary | ICD-10-CM | POA: Diagnosis not present

## 2022-02-15 DIAGNOSIS — G2581 Restless legs syndrome: Secondary | ICD-10-CM | POA: Diagnosis not present

## 2022-02-15 DIAGNOSIS — F411 Generalized anxiety disorder: Secondary | ICD-10-CM | POA: Diagnosis not present

## 2022-02-15 DIAGNOSIS — K219 Gastro-esophageal reflux disease without esophagitis: Secondary | ICD-10-CM | POA: Diagnosis not present

## 2022-02-15 DIAGNOSIS — Z6836 Body mass index (BMI) 36.0-36.9, adult: Secondary | ICD-10-CM | POA: Diagnosis not present

## 2022-02-15 DIAGNOSIS — I4711 Inappropriate sinus tachycardia, so stated: Secondary | ICD-10-CM | POA: Diagnosis not present

## 2022-02-15 DIAGNOSIS — F331 Major depressive disorder, recurrent, moderate: Secondary | ICD-10-CM | POA: Diagnosis not present

## 2022-02-15 DIAGNOSIS — E6609 Other obesity due to excess calories: Secondary | ICD-10-CM | POA: Diagnosis not present

## 2022-02-15 DIAGNOSIS — Z79899 Other long term (current) drug therapy: Secondary | ICD-10-CM | POA: Diagnosis not present

## 2022-03-07 DIAGNOSIS — R0789 Other chest pain: Secondary | ICD-10-CM | POA: Diagnosis not present

## 2022-03-09 DIAGNOSIS — F331 Major depressive disorder, recurrent, moderate: Secondary | ICD-10-CM | POA: Diagnosis not present

## 2022-03-09 DIAGNOSIS — F429 Obsessive-compulsive disorder, unspecified: Secondary | ICD-10-CM | POA: Diagnosis not present

## 2022-03-09 DIAGNOSIS — F909 Attention-deficit hyperactivity disorder, unspecified type: Secondary | ICD-10-CM | POA: Diagnosis not present

## 2022-03-09 DIAGNOSIS — F633 Trichotillomania: Secondary | ICD-10-CM | POA: Diagnosis not present

## 2022-03-09 DIAGNOSIS — F419 Anxiety disorder, unspecified: Secondary | ICD-10-CM | POA: Diagnosis not present

## 2022-04-06 DIAGNOSIS — F429 Obsessive-compulsive disorder, unspecified: Secondary | ICD-10-CM | POA: Diagnosis not present

## 2022-05-04 DIAGNOSIS — F429 Obsessive-compulsive disorder, unspecified: Secondary | ICD-10-CM | POA: Diagnosis not present

## 2022-05-11 DIAGNOSIS — F633 Trichotillomania: Secondary | ICD-10-CM | POA: Diagnosis not present

## 2022-05-11 DIAGNOSIS — F900 Attention-deficit hyperactivity disorder, predominantly inattentive type: Secondary | ICD-10-CM | POA: Diagnosis not present

## 2022-05-11 DIAGNOSIS — F331 Major depressive disorder, recurrent, moderate: Secondary | ICD-10-CM | POA: Diagnosis not present

## 2022-06-02 DIAGNOSIS — F429 Obsessive-compulsive disorder, unspecified: Secondary | ICD-10-CM | POA: Diagnosis not present

## 2022-06-09 DIAGNOSIS — K219 Gastro-esophageal reflux disease without esophagitis: Secondary | ICD-10-CM | POA: Diagnosis not present

## 2022-06-09 DIAGNOSIS — E669 Obesity, unspecified: Secondary | ICD-10-CM | POA: Diagnosis not present

## 2022-06-09 DIAGNOSIS — G2581 Restless legs syndrome: Secondary | ICD-10-CM | POA: Diagnosis not present

## 2022-06-09 DIAGNOSIS — Z87891 Personal history of nicotine dependence: Secondary | ICD-10-CM | POA: Diagnosis not present

## 2022-06-09 DIAGNOSIS — F324 Major depressive disorder, single episode, in partial remission: Secondary | ICD-10-CM | POA: Diagnosis not present

## 2022-06-09 DIAGNOSIS — I472 Ventricular tachycardia, unspecified: Secondary | ICD-10-CM | POA: Diagnosis not present

## 2022-06-09 DIAGNOSIS — F411 Generalized anxiety disorder: Secondary | ICD-10-CM | POA: Diagnosis not present

## 2022-06-09 DIAGNOSIS — Z6835 Body mass index (BMI) 35.0-35.9, adult: Secondary | ICD-10-CM | POA: Diagnosis not present

## 2022-06-09 DIAGNOSIS — F909 Attention-deficit hyperactivity disorder, unspecified type: Secondary | ICD-10-CM | POA: Diagnosis not present

## 2023-01-08 ENCOUNTER — Telehealth: Payer: 59 | Admitting: Family Medicine

## 2023-01-08 DIAGNOSIS — S39012A Strain of muscle, fascia and tendon of lower back, initial encounter: Secondary | ICD-10-CM | POA: Diagnosis not present

## 2023-01-08 MED ORDER — NAPROXEN 500 MG PO TABS: 500.0000 mg | ORAL_TABLET | Freq: Two times a day (BID) | ORAL | 0 refills | Status: AC

## 2023-01-08 MED ORDER — CYCLOBENZAPRINE HCL 10 MG PO TABS: 10.0000 mg | ORAL_TABLET | Freq: Three times a day (TID) | ORAL | 0 refills | Status: AC | PRN

## 2023-01-08 NOTE — Progress Notes (Signed)
Virtual Visit Consent   Jamie Hurley, you are scheduled for a virtual visit with a [facility] provider today. Just as with appointments in the office, your consent must be obtained to participate. Your consent will be active for this visit and any virtual visit you may have with one of our providers in the next 365 days. If you have a MyChart account, a copy of this consent can be sent to you electronically.  As this is a virtual visit, video technology does not allow for your provider to perform a traditional examination. This may limit your provider's ability to fully assess your condition. If your provider identifies any concerns that need to be evaluated in person or the need to arrange testing (such as labs, EKG, etc.), we will make arrangements to do so. Although advances in technology are sophisticated, we cannot ensure that it will always work on either your end or our end. If the connection with a video visit is poor, the visit may have to be switched to a telephone visit. With either a video or telephone visit, we are not always able to ensure that we have a secure connection.  By engaging in this virtual visit, you consent to the provision of healthcare and authorize for your insurance to be billed (if applicable) for the services provided during this visit. Depending on your insurance coverage, you may receive a charge related to this service.  I need to obtain your verbal consent now. Are you willing to proceed with your visit today? Jamie Hurley has provided verbal consent on 01/08/2023 for a virtual visit (video or telephone). Georgana Curio, FNP  Date: 01/08/2023 12:36 PM  Virtual Visit via Video Note   I, Georgana Curio, connected with  Jamie Hurley  ([id number], 01-11-1990) on 01/08/23 at 12:30 PM EST by a video-enabled telemedicine application and verified that I am speaking with the correct person using two identifiers.  Location: Patient: Virtual Visit  Location Patient: Home Provider: Virtual Visit Location Provider: Home Office   I discussed the limitations of evaluation and management by telemedicine and the availability of in person appointments. The patient expressed understanding and agreed to proceed.    History of Present Illness: Jamie Hurley is a 32 y.o. who identifies as a female who was assigned female at birth, and is being seen today for left low back pain since lifting yesterday. She had the same problem 2 months ago. No bowel or bladder issues.   HPI: HPI  Problems: There are no active problems to display for this patient.   Allergies: No Known Allergies Medications:  Current Outpatient Medications:    cyclobenzaprine (FLEXERIL) 10 MG tablet, Take 1 tablet (10 mg total) by mouth 3 (three) times daily as needed for muscle spasms., Disp: 30 tablet, Rfl: 0   naproxen (NAPROSYN) 500 MG tablet, Take 1 tablet (500 mg total) by mouth 2 (two) times daily with a meal for 10 days., Disp: 20 tablet, Rfl: 0   acetaminophen (TYLENOL) 500 MG tablet, Take 500 mg by mouth every 6 (six) hours as needed., Disp: , Rfl:    buPROPion (WELLBUTRIN XL) 150 MG 24 hr tablet, Take 1 tablet (150 mg total) by mouth daily., Disp: 30 tablet, Rfl: 1   famotidine (PEPCID) 40 MG tablet, , Disp: , Rfl: 0   gabapentin (NEURONTIN) 100 MG capsule, Take 200 mg by mouth daily., Disp: , Rfl: 2   ibuprofen (ADVIL,MOTRIN) 400 MG tablet, Take 400 mg by mouth every  6 (six) hours as needed., Disp: , Rfl:    nadolol (CORGARD) 20 MG tablet, , Disp: , Rfl: 10   nortriptyline (PAMELOR) 10 MG capsule, Take 20 mg by mouth every 30 (thirty) days., Disp: , Rfl:    nortriptyline (PAMELOR) 50 MG capsule, Take 50 mg by mouth at bedtime., Disp: , Rfl:    ondansetron (ZOFRAN-ODT) 4 MG disintegrating tablet, Take 1 tablet (4 mg total) by mouth every 8 (eight) hours as needed for nausea or vomiting., Disp: 20 tablet, Rfl: 0   traZODone (DESYREL) 50 MG tablet, Take 1 tablet  (50 mg total) by mouth at bedtime., Disp: 30 tablet, Rfl: 2   venlafaxine XR (EFFEXOR-XR) 150 MG 24 hr capsule, Take total of 225 mg daily, along with 75 mg cap, Disp: 30 capsule, Rfl: 2   venlafaxine XR (EFFEXOR-XR) 75 MG 24 hr capsule, Take total of 225 mg daily, along with 150 mg cap, Disp: 30 capsule, Rfl: 2  Observations/Objective: Patient is well-developed, well-nourished in no acute distress.  Resting comfortably  at home.  Head is normocephalic, atraumatic.  No labored breathing.  Speech is clear and coherent with logical content.  Patient is alert and oriented at baseline.    Assessment and Plan: 1. Strain of lumbar region, initial encounter (Primary)  Heat, med use and side effects discussed, UC as needed.   Follow Up Instructions: I discussed the assessment and treatment plan with the patient. The patient was provided an opportunity to ask questions and all were answered. The patient agreed with the plan and demonstrated an understanding of the instructions.  A copy of instructions were sent to the patient via MyChart unless otherwise noted below.     The patient was advised to call back or seek an in-person evaluation if the symptoms worsen or if the condition fails to improve as anticipated.    Georgana Curio, FNP

## 2023-01-08 NOTE — Patient Instructions (Signed)
Acute Back Pain, Adult Acute back pain is sudden and usually short-lived. It is often caused by an injury to the muscles and tissues in the back. The injury may result from: A muscle, tendon, or ligament getting overstretched or torn. Ligaments are tissues that connect bones to each other. Lifting something improperly can cause a back strain. Wear and tear (degeneration) of the spinal disks. Spinal disks are circular tissue that provide cushioning between the bones of the spine (vertebrae). Twisting motions, such as while playing sports or doing yard work. A hit to the back. Arthritis. You may have a physical exam, lab tests, and imaging tests to find the cause of your pain. Acute back pain usually goes away with rest and home care. Follow these instructions at home: Managing pain, stiffness, and swelling Take over-the-counter and prescription medicines only as told by your health care provider. Treatment may include medicines for pain and inflammation that are taken by mouth or applied to the skin, or muscle relaxants. Your health care provider may recommend applying ice during the first 24-48 hours after your pain starts. To do this: Put ice in a plastic bag. Place a towel between your skin and the bag. Leave the ice on for 20 minutes, 2-3 times a day. Remove the ice if your skin turns bright red. This is very important. If you cannot feel pain, heat, or cold, you have a greater risk of damage to the area. If directed, apply heat to the affected area as often as told by your health care provider. Use the heat source that your health care provider recommends, such as a moist heat pack or a heating pad. Place a towel between your skin and the heat source. Leave the heat on for 20-30 minutes. Remove the heat if your skin turns bright red. This is especially important if you are unable to feel pain, heat, or cold. You have a greater risk of getting burned. Activity  Do not stay in bed. Staying in  bed for more than 1-2 days can delay your recovery. Sit up and stand up straight. Avoid leaning forward when you sit or hunching over when you stand. If you work at a desk, sit close to it so you do not need to lean over. Keep your chin tucked in. Keep your neck drawn back, and keep your elbows bent at a 90-degree angle (right angle). Sit high and close to the steering wheel when you drive. Add lower back (lumbar) support to your car seat, if needed. Take short walks on even surfaces as soon as you are able. Try to increase the length of time you walk each day. Do not sit, drive, or stand in one place for more than 30 minutes at a time. Sitting or standing for long periods of time can put stress on your back. Do not drive or use heavy machinery while taking prescription pain medicine. Use proper lifting techniques. When you bend and lift, use positions that put less stress on your back: Bend your knees. Keep the load close to your body. Avoid twisting. Exercise regularly as told by your health care provider. Exercising helps your back heal faster and helps prevent back injuries by keeping muscles strong and flexible. Work with a physical therapist to make a safe exercise program, as recommended by your health care provider. Do any exercises as told by your physical therapist. Lifestyle Maintain a healthy weight. Extra weight puts stress on your back and makes it difficult to have good   posture. Avoid activities or situations that make you feel anxious or stressed. Stress and anxiety increase muscle tension and can make back pain worse. Learn ways to manage anxiety and stress, such as through exercise. General instructions Sleep on a firm mattress in a comfortable position. Try lying on your side with your knees slightly bent. If you lie on your back, put a pillow under your knees. Keep your head and neck in a straight line with your spine (neutral position) when using electronic equipment like  smartphones or pads. To do this: Raise your smartphone or pad to look at it instead of bending your head or neck to look down. Put the smartphone or pad at the level of your face while looking at the screen. Follow your treatment plan as told by your health care provider. This may include: Cognitive or behavioral therapy. Acupuncture or massage therapy. Meditation or yoga. Contact a health care provider if: You have pain that is not relieved with rest or medicine. You have increasing pain going down into your legs or buttocks. Your pain does not improve after 2 weeks. You have pain at night. You lose weight without trying. You have a fever or chills. You develop nausea or vomiting. You develop abdominal pain. Get help right away if: You develop new bowel or bladder control problems. You have unusual weakness or numbness in your arms or legs. You feel faint. These symptoms may represent a serious problem that is an emergency. Do not wait to see if the symptoms will go away. Get medical help right away. Call your local emergency services (911 in the [place] Do not drive yourself to the hospital. Summary Acute back pain is sudden and usually short-lived. Use proper lifting techniques. When you bend and lift, use positions that put less stress on your back. Take over-the-counter and prescription medicines only as told by your health care provider, and apply heat or ice as told. This information is not intended to replace advice given to you by your health care provider. Make sure you discuss any questions you have with your health care provider. Document Revised: 03/20/2020 Document Reviewed: 03/20/2020 Elsevier Patient Education  2024 [employer]  

## 2023-11-02 ENCOUNTER — Ambulatory Visit
Admission: RE | Admit: 2023-11-02 | Discharge: 2023-11-02 | Disposition: A | Discharge status: Home / Self Care | Attending: Family Medicine | Admitting: Family Medicine

## 2023-11-02 ENCOUNTER — Telehealth: Payer: Self-pay | Admitting: Family Medicine

## 2023-11-02 VITALS — BP 120/84 | HR 92 | Temp 97.8°F | Resp 18

## 2023-11-02 DIAGNOSIS — R5383 Other fatigue: Secondary | ICD-10-CM | POA: Diagnosis not present

## 2023-11-02 DIAGNOSIS — R509 Fever, unspecified: Secondary | ICD-10-CM | POA: Diagnosis not present

## 2023-11-02 DIAGNOSIS — R109 Unspecified abdominal pain: Secondary | ICD-10-CM

## 2023-11-02 DIAGNOSIS — R112 Nausea with vomiting, unspecified: Secondary | ICD-10-CM

## 2023-11-02 LAB — CBC WITH DIFFERENTIAL/PLATELET
Abs Immature Granulocytes: 0.02 K/uL (ref 0.00–0.07)
Basophils Absolute: 0 K/uL (ref 0.0–0.1)
Basophils Relative: 1 %
Eosinophils Absolute: 0.1 K/uL (ref 0.0–0.5)
Eosinophils Relative: 2 %
HCT: 40.9 % (ref 36.0–46.0)
Hemoglobin: 14.2 g/dL (ref 12.0–15.0)
Immature Granulocytes: 0 %
Lymphocytes Relative: 29 %
Lymphs Abs: 1.5 K/uL (ref 0.7–4.0)
MCH: 30.7 pg (ref 26.0–34.0)
MCHC: 34.7 g/dL (ref 30.0–36.0)
MCV: 88.3 fL (ref 80.0–100.0)
Monocytes Absolute: 0.6 K/uL (ref 0.1–1.0)
Monocytes Relative: 11 %
Neutro Abs: 3.1 K/uL (ref 1.7–7.7)
Neutrophils Relative %: 57 %
Platelets: 308 K/uL (ref 150–400)
RBC: 4.63 MIL/uL (ref 3.87–5.11)
RDW: 12.8 % (ref 11.5–15.5)
WBC: 5.2 K/uL (ref 4.0–10.5)
nRBC: 0 % (ref 0.0–0.2)

## 2023-11-02 LAB — GLUCOSE, CAPILLARY: Glucose-Capillary: 109 mg/dL — ABNORMAL HIGH (ref 70–99)

## 2023-11-02 LAB — COMPREHENSIVE METABOLIC PANEL WITH GFR
ALT: 16 U/L (ref 0–44)
AST: 21 U/L (ref 15–41)
Albumin: 3.8 g/dL (ref 3.5–5.0)
Alkaline Phosphatase: 44 U/L (ref 38–126)
Anion gap: 10 (ref 5–15)
BUN: 10 mg/dL (ref 6–20)
CO2: 23 mmol/L (ref 22–32)
Calcium: 8.3 mg/dL — ABNORMAL LOW (ref 8.9–10.3)
Chloride: 102 mmol/L (ref 98–111)
Creatinine, Ser: 0.5 mg/dL (ref 0.44–1.00)
GFR, Estimated: >60 mL/min (ref >60)
Glucose, Bld: 103 mg/dL — ABNORMAL HIGH (ref 70–99)
Potassium: 4.1 mmol/L (ref 3.5–5.1)
Sodium: 135 mmol/L (ref 135–145)
Total Bilirubin: 0.3 mg/dL (ref 0.0–1.2)
Total Protein: 7.7 g/dL (ref 6.5–8.1)

## 2023-11-02 LAB — LIPASE, BLOOD: Lipase: 33 U/L (ref 11–51)

## 2023-11-02 MED ORDER — ONDANSETRON 4 MG PO TBDP: 4.0000 mg | ORAL_TABLET | Freq: Three times a day (TID) | ORAL | 0 refills | Status: AC | PRN

## 2023-11-02 MED ORDER — ONDANSETRON HCL 4 MG/2ML IJ SOLN
4.0000 mg | Freq: Once | INTRAMUSCULAR | Status: AC
  Administered 2023-11-02: 4 mg via INTRAMUSCULAR

## 2023-11-02 NOTE — Discharge Instructions (Addendum)
 Your labs are normal.  Your pancrease function test is pending. If abnormal, someone will call you.  You will see your result in MyChart.   Go to ED for red flag symptoms, including; fevers you cannot reduce with Tylenol/Motrin, severe headaches, vision changes, numbness/weakness in part of the body, lethargy, confusion, intractable vomiting, severe dehydration, chest pain, breathing difficulty, severe persistent abdominal or pelvic pain, signs of severe infection (increased redness, swelling of an area), feeling faint or passing out, dizziness, etc. You should especially go to the ED for sudden acute worsening of condition if you do not elect to go at this time.

## 2023-11-02 NOTE — ED Triage Notes (Signed)
 Fever, chills and body aches started 2-3 days ago. Home Intervention: Tylenol  Neg home covid test.

## 2023-11-02 NOTE — ED Provider Notes (Signed)
 [facility]    CSN: [id number] Arrival date & time: 11/02/23  1246      History   Chief Complaint Chief Complaint  Patient presents with   Fever    Fever, chills, today aches since 10/22 at 5am.  Vomited 1x early 10/22.  Sweating through clothing and bed sheets once Tylenol wears off arter 4 hours.  Husband noticed sweet smelling sweat this morning when changing clothes. - Entered by patient    HPI Hema [place] Elks is a 33 y.o. female.   HPI  History obtained from the patient. [place] presents for 1 day of fever, chills, vomiting, headache, body aches.  Took some Tylenol every 4 hours. Has sweating when the Tylenol wears off.  Last dose was about 2-3 hours ago.  She doesn't have a appetite. No diarrhea. Endorses abdominal pain and left sided lower back pain.  Patient's last menstrual period was 10/28/2023 (approximate).  Denies cough, rhinorrhea, sore throat or nasal congestion.  Home COVID test was negative.    Last ate around 1030 PM - water with [employer]-tortilla soup and drank water. She is concerned about diabetes as her husband told her her sweat smelled sweet.      Past Medical History:  Diagnosis Date   ADD (attention deficit disorder)    Anxiety    Depression    GERD (gastroesophageal reflux disease)    H/O sinus tachycardia    Hypertension    Tachycardia     There are no active problems to display for this patient.   Past Surgical History:  Procedure Laterality Date   FOOT SURGERY      OB History     Gravida  1   Para  0   Term  0   Preterm  0   AB  1   Living         SAB  0   IAB  0   Ectopic  0   Multiple      Live Births               Home Medications    Prior to Admission medications   Medication Sig Start Date End Date Taking? Authorizing Provider  acetaminophen (TYLENOL) 500 MG tablet Take 500 mg by mouth every 6 (six) hours as needed.   Yes [provider]  cyclobenzaprine  (FLEXERIL )  10 MG tablet Take 1 tablet (10 mg total) by mouth 3 (three) times daily as needed for muscle spasms. 01/08/23  Yes Blair, Diane W, FNP  famotidine (PEPCID) 40 MG tablet  11/24/17  Yes [provider]  gabapentin (NEURONTIN) 100 MG capsule Take 200 mg by mouth daily. 11/17/17  Yes [provider]  ibuprofen (ADVIL,MOTRIN) 400 MG tablet Take 400 mg by mouth every 6 (six) hours as needed.   Yes [provider]  venlafaxine  XR (EFFEXOR -XR) 150 MG 24 hr capsule Take total of 225 mg daily, along with 75 mg cap 07/21/20  Yes Hisada, Katheren, MD  venlafaxine  XR (EFFEXOR -XR) 75 MG 24 hr capsule Take total of 225 mg daily, along with 150 mg cap 07/21/20  Yes Hisada, Reina, MD  buPROPion  (WELLBUTRIN  XL) 150 MG 24 hr tablet Take 1 tablet (150 mg total) by mouth daily. 08/25/20 10/24/20  Vickey Katheren, MD  nadolol (CORGARD) 20 MG tablet  11/17/17   [provider]  nortriptyline (PAMELOR) 10 MG capsule Take 20 mg by mouth every 30 (thirty) days.    [provider]  nortriptyline (PAMELOR)  50 MG capsule Take 50 mg by mouth at bedtime.    [provider]  ondansetron  (ZOFRAN -ODT) 4 MG disintegrating tablet Take 1 tablet (4 mg total) by mouth every 8 (eight) hours as needed for nausea or vomiting. 11/02/23   Jonathyn Carothers, DO  traZODone  (DESYREL ) 50 MG tablet Take 1 tablet (50 mg total) by mouth at bedtime. 07/21/20 10/19/20  Vickey Mettle, MD  citalopram (CELEXA) 10 MG tablet Take 10 mg by mouth daily.  01/05/19  [provider]  JUNEL FE 1/20 1-20 MG-MCG tablet TK 1 T PO QD 09/01/17 01/05/19  [provider]  metoprolol tartrate (LOPRESSOR) 25 MG tablet Take 25 mg by mouth 2 (two) times daily.  01/05/19  [provider]  nebivolol (BYSTOLIC) 5 MG tablet Take 5 mg by mouth daily.  01/05/19  [provider]  omeprazole (PRILOSEC) 20 MG capsule Take 20 mg by mouth daily.  01/05/19  [provider]    Family History Family  History  Problem Relation Age of Onset   Diabetes Mother    Anxiety disorder Father    Depression Father    Diabetes Father    Depression Paternal Uncle    Diabetes Paternal Uncle    Diabetes Paternal Grandmother     Social History Social History   Tobacco Use   Smoking status: Never   Smokeless tobacco: Current    Types: Chew  Vaping Use   Vaping status: Never Used  Substance Use Topics   Alcohol use: Yes    Comment: occasional   Drug use: Not Currently     Allergies   Patient has no known allergies.   Review of Systems Review of Systems: negative unless otherwise stated in HPI.      Physical Exam Triage Vital Signs ED Triage Vitals [11/02/23 1258]  Encounter Vitals Group     BP 120/84     Girls Systolic BP Percentile      Girls Diastolic BP Percentile      Boys Systolic BP Percentile      Boys Diastolic BP Percentile      Pulse Rate 92     Resp 18     Temp 97.8 F (36.6 C)     Temp Source Oral     SpO2 100 %     Weight      Height      Head Circumference      Peak Flow      Pain Score      Pain Loc      Pain Education      Exclude from Growth Chart    No data found.  Updated Vital Signs BP 120/84 (BP Location: Left Arm)   Pulse 92   Temp 97.8 F (36.6 C) (Oral)   Resp 18   LMP 10/28/2023 (Approximate)   SpO2 100%   Visual Acuity Right Eye Distance:   Left Eye Distance:   Bilateral Distance:    Right Eye Near:   Left Eye Near:    Bilateral Near:     Physical Exam GEN:     alert, non-toxic appearing female in no distress    HENT:  mucus membranes moist, oropharyngeal without lesions or erythema, no tonsillar hypertrophy or exudates, no nasal discharge EYES:   no scleral injection, jaundice or discharge NECK:  normal ROM, no meningismus   RESP:  no increased work of breathing, clear to auscultation bilaterally CVS:   regular rate and rhythm ABD:   Soft,  epigastric and periumbilical TTP, nondistended, no guarding, no rebound, active  bowel sounds throughout, negative McBurney's, negative Murphy's Skin:   warm and dry, no rash on visible skin    UC Treatments / Results  Labs (all labs ordered are listed, but only abnormal results are displayed) Labs Reviewed  GLUCOSE, CAPILLARY - Abnormal; Notable for the following components:      Result Value   Glucose-Capillary 109 (*)    All other components within normal limits  COMPREHENSIVE METABOLIC PANEL WITH GFR - Abnormal; Notable for the following components:   Glucose, Bld 103 (*)    Calcium 8.3 (*)    All other components within normal limits  CBC WITH DIFFERENTIAL/PLATELET  LIPASE, BLOOD  CBG MONITORING, ED    EKG   Radiology No results found.   Procedures Procedures (including critical care time)  Medications Ordered in UC Medications  ondansetron  (ZOFRAN ) injection 4 mg (4 mg Intramuscular Given 11/02/23 1340)    Initial Impression / Assessment and Plan / UC Course  I have reviewed the triage vital signs and the nursing notes.  Pertinent labs & imaging results that were available during my care of the patient were reviewed by me and considered in my medical decision making (see chart for details).  Clinical Course as of 11/02/23 1828  Thu Nov 02, 2023  1454 Called the lab to inquire about pt's CBC as CMP and was told it'll be a while the machine didn't like the label.  [VB]    Clinical Course User Index [VB] Kriste Berth, DO      Pt is a 33 y.o. female who presents for fever, body aches, vomiting  and abdominal pain. Adelei is afebrile here though had recent antipyretics. Satting well on room air. Overall pt is non-toxic appearing, well hydrated, without respiratory distress. PO vs IM anti-emetic discussed and pt choose IM. Zofran  4 mg IM given.    Abdominal exam is remarkable for epigastric and periumbilical tenderness but doubt acute abdomen. Home COVID was negative.   Suspect viral illness however could not rule in or out  intra-abdominal causes. DDX includes but not limited to: UTI, STI, cholecystitis, pancreatitis, gastroenteritis, nephrolithiasis, constipation, appendicitis, ovarian torsion/cyst  Pt concerned for hyperglycemia.  CBG was 109.  Doubt acute DKA. Obtain CBC, CMP, and lipase.  No personal history of kidney stones. CBC normal. CMP unremarkable. She has no urinary symptoms therefore urinalysis deferred for now. Zofran  prescribed.  Discussed symptomatic treatment.  Stressed the importance of hydration.   Typical duration of symptoms discussed.   Return and ED precautions given and voiced understanding. Discussed MDM, treatment plan and plan for follow-up with patient and her husband who agrees with plan.     Final Clinical Impressions(s) / UC Diagnoses   Final diagnoses:  Other fatigue  Nausea and vomiting, unspecified vomiting type  Fever, unspecified  Abdominal pain, unspecified abdominal location     Discharge Instructions      Your labs are normal.  Your pancrease function test is pending. If abnormal, someone will call you.  You will see your result in MyChart.   Go to ED for red flag symptoms, including; fevers you cannot reduce with Tylenol/Motrin, severe headaches, vision changes, numbness/weakness in part of the body, lethargy, confusion, intractable vomiting, severe dehydration, chest pain, breathing difficulty, severe persistent abdominal or pelvic pain, signs of severe infection (increased redness, swelling of an area), feeling faint or passing out, dizziness, etc. You should especially go to the ED for sudden acute  worsening of condition if you do not elect to go at this time.       ED Prescriptions     Medication Sig Dispense Auth. Provider   ondansetron  (ZOFRAN -ODT) 4 MG disintegrating tablet Take 1 tablet (4 mg total) by mouth every 8 (eight) hours as needed for nausea or vomiting. 20 tablet Shelley Pooley, DO      PDMP not reviewed this encounter.   Jaysa Kise,  DO 11/02/23 1828

## 2023-11-03 ENCOUNTER — Ambulatory Visit (HOSPITAL_COMMUNITY): Payer: Self-pay
